# Patient Record
Sex: Female | Born: 1990 | Race: Black or African American | Hispanic: No | Marital: Single | Smoking: Never smoker
Health system: Southern US, Community
[De-identification: ages and names within clinical notes are randomized; demographics above are authoritative.]

---
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
## 2009-11-29 ENCOUNTER — Emergency Department (HOSPITAL_COMMUNITY): Admission: EM | Admit: 2009-11-29 | Discharge: 2009-11-29 | Payer: Self-pay | Admitting: Emergency Medicine

## 2009-12-16 ENCOUNTER — Emergency Department (HOSPITAL_COMMUNITY): Admission: EM | Admit: 2009-12-16 | Discharge: 2009-12-16 | Payer: Self-pay | Admitting: Emergency Medicine

## 2010-06-26 ENCOUNTER — Emergency Department (HOSPITAL_COMMUNITY): Admission: EM | Admit: 2010-06-26 | Discharge: 2010-06-27 | Payer: Self-pay | Admitting: Emergency Medicine

## 2011-01-11 LAB — DIFFERENTIAL
Basophils Relative: 0 % (ref 0–1)
Eosinophils Absolute: 0.2 10*3/uL (ref 0.0–0.7)
Lymphs Abs: 1.6 10*3/uL (ref 0.7–4.0)
Monocytes Relative: 6 % (ref 3–12)
Neutro Abs: 6.5 10*3/uL (ref 1.7–7.7)
Neutrophils Relative %: 73 % (ref 43–77)

## 2011-01-11 LAB — URINALYSIS, ROUTINE W REFLEX MICROSCOPIC
Glucose, UA: NEGATIVE mg/dL
Specific Gravity, Urine: >1.03 — ABNORMAL HIGH (ref 1.005–1.030)
pH: 5.5 (ref 5.0–8.0)

## 2011-01-11 LAB — BASIC METABOLIC PANEL
Calcium: 8.8 mg/dL (ref 8.4–10.5)
Creatinine, Ser: 0.74 mg/dL (ref 0.4–1.2)
GFR calc Af Amer: >60 mL/min (ref >60)
GFR calc non Af Amer: >60 mL/min (ref >60)
Sodium: 139 mEq/L (ref 135–145)

## 2011-01-11 LAB — URINE MICROSCOPIC-ADD ON

## 2011-01-11 LAB — CBC
Hemoglobin: 11.5 g/dL — ABNORMAL LOW (ref 12.0–15.0)
MCHC: 31.7 g/dL (ref 30.0–36.0)
Platelets: 278 10*3/uL (ref 150–400)
RBC: 4.58 MIL/uL (ref 3.87–5.11)

## 2011-05-31 ENCOUNTER — Emergency Department (HOSPITAL_COMMUNITY)
Admission: EM | Admit: 2011-05-31 | Discharge: 2011-05-31 | Disposition: A | Discharge status: Home / Self Care | Payer: Worker's Compensation | Attending: Emergency Medicine | Admitting: Emergency Medicine

## 2011-05-31 DIAGNOSIS — S8390XA Sprain of unspecified site of unspecified knee, initial encounter: Secondary | ICD-10-CM

## 2011-05-31 DIAGNOSIS — R296 Repeated falls: Secondary | ICD-10-CM | POA: Insufficient documentation

## 2011-05-31 DIAGNOSIS — Y9269 Other specified industrial and construction area as the place of occurrence of the external cause: Secondary | ICD-10-CM | POA: Insufficient documentation

## 2011-05-31 DIAGNOSIS — IMO0002 Reserved for concepts with insufficient information to code with codable children: Secondary | ICD-10-CM | POA: Insufficient documentation

## 2011-05-31 MED ORDER — ONDANSETRON HCL 4 MG PO TABS
4.0000 mg | ORAL_TABLET | Freq: Once | ORAL | Status: AC
  Administered 2011-05-31: 4 mg via ORAL

## 2011-05-31 MED ORDER — HYDROCODONE-ACETAMINOPHEN 5-325 MG PO TABS: ORAL_TABLET | ORAL | Status: DC

## 2011-05-31 MED ORDER — HYDROCODONE-ACETAMINOPHEN 5-325 MG PO TABS
2.0000 | ORAL_TABLET | Freq: Once | ORAL | Status: AC
  Administered 2011-05-31: 2 via ORAL

## 2011-05-31 MED ORDER — KETOROLAC TROMETHAMINE 10 MG PO TABS
10.0000 mg | ORAL_TABLET | Freq: Once | ORAL | Status: AC
  Administered 2011-05-31: 10 mg via ORAL

## 2011-05-31 MED ORDER — IBUPROFEN 800 MG PO TABS: ORAL_TABLET | ORAL | Status: DC

## 2011-05-31 NOTE — ED Provider Notes (Signed)
History     CSN: [id number] Arrival date & time: 05/31/2011  9:55 PM  Chief Complaint  Patient presents with  . Knee Injury   Patient is a 20 y.o. female presenting with knee pain. The history is provided by the patient.  Knee Pain This is a new problem. The current episode started today. The problem occurs constantly. The problem has been unchanged. Pertinent negatives include no abdominal pain, arthralgias, chest pain, coughing or neck pain. The symptoms are aggravated by bending, standing and twisting.    History reviewed. No pertinent past medical history.  History reviewed. No pertinent past surgical history.  No family history on file.  History  Substance Use Topics  . Smoking status: Never Smoker   . Smokeless tobacco: Not on file  . Alcohol Use: No    OB History    Grav Para Term Preterm Abortions TAB SAB Ect Mult Living                  Review of Systems  Constitutional: Negative for activity change.       All ROS Neg except as noted in HPI  HENT: Negative for nosebleeds and neck pain.   Eyes: Negative for photophobia and discharge.  Respiratory: Negative for cough, shortness of breath and wheezing.   Cardiovascular: Negative for chest pain and palpitations.  Gastrointestinal: Negative for abdominal pain and blood in stool.  Genitourinary: Negative for dysuria, frequency and hematuria.  Musculoskeletal: Negative for back pain and arthralgias.  Skin: Negative.   Neurological: Negative for dizziness, seizures and speech difficulty.  Psychiatric/Behavioral: Negative for hallucinations and confusion.    Physical Exam  BP 132/63  Pulse 90  Temp(Src) 98.4 F (36.9 C) (Oral)  Resp 16  Wt 210 lb (95.255 kg)  SpO2 99%  LMP 05/29/2011  Physical Exam  Nursing note and vitals reviewed. Constitutional: She is oriented to person, place, and time. She appears well-developed and well-nourished.  Non-toxic appearance.  HENT:  Head: Normocephalic.  Right Ear:  Tympanic membrane and external ear normal.  Left Ear: Tympanic membrane and external ear normal.  Eyes: EOM and lids are normal. Pupils are equal, round, and reactive to light.  Neck: Normal range of motion. Neck supple. Carotid bruit is not present.  Cardiovascular: Normal rate, regular rhythm, normal heart sounds, intact distal pulses and normal pulses.   Pulmonary/Chest: Breath sounds normal. No respiratory distress.  Abdominal: Soft. Bowel sounds are normal. There is no tenderness. There is no guarding.  Musculoskeletal:       Pain with attempted ROM of the left knee. No effusion. Not hot. No posterior mass. No dislocation. Distal pulses wnl.  Lymphadenopathy:       Head (right side): No submandibular adenopathy present.       Head (left side): No submandibular adenopathy present.    She has no cervical adenopathy.  Neurological: She is alert and oriented to person, place, and time. She has normal strength. No cranial nerve deficit or sensory deficit.  Skin: Skin is warm and dry.  Psychiatric: She has a normal mood and affect. Her speech is normal.    ED Course  Procedures  MDM I have reviewed nursing notes, vital signs, and all appropriate lab and imaging results for this patient.      Kathie Dike, [place] 05/31/11 2307

## 2011-05-31 NOTE — ED Notes (Signed)
SLIPPED ON WET FLOOR AT WORK, TWISTED LEFT KNEE

## 2011-05-31 NOTE — ED Notes (Signed)
Pt actually slipped and twisted left knee. She didn't fall and hit knee.

## 2011-06-17 NOTE — ED Provider Notes (Signed)
Patient evaluated and managed under my supervision by this mid-level provider.  Gerhard Munch, MD 06/17/11 2152

## 2012-06-07 ENCOUNTER — Emergency Department (HOSPITAL_COMMUNITY)
Admission: EM | Admit: 2012-06-07 | Discharge: 2012-06-08 | Disposition: A | Discharge status: Home / Self Care | Payer: No Typology Code available for payment source | Attending: Emergency Medicine | Admitting: Emergency Medicine

## 2012-06-07 ENCOUNTER — Encounter (HOSPITAL_COMMUNITY): Payer: Self-pay

## 2012-06-07 DIAGNOSIS — M545 Low back pain, unspecified: Secondary | ICD-10-CM | POA: Insufficient documentation

## 2012-06-07 DIAGNOSIS — Z043 Encounter for examination and observation following other accident: Secondary | ICD-10-CM | POA: Insufficient documentation
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
## 2012-06-08 MED ORDER — IBUPROFEN 600 MG PO TABS: 600.0000 mg | ORAL_TABLET | Freq: Three times a day (TID) | ORAL | Status: AC | PRN

## 2012-06-08 MED ORDER — HYDROCODONE-ACETAMINOPHEN 5-500 MG PO TABS: 1.0000 | ORAL_TABLET | Freq: Four times a day (QID) | ORAL | Status: AC | PRN

## 2012-06-08 MED ORDER — KETOROLAC TROMETHAMINE 60 MG/2ML IM SOLN
60.0000 mg | Freq: Once | INTRAMUSCULAR | Status: AC
  Administered 2012-06-08: 60 mg via INTRAMUSCULAR
  Filled 2012-06-08: qty 2

## 2012-06-08 MED ORDER — OXYCODONE-ACETAMINOPHEN 5-325 MG PO TABS
1.0000 | ORAL_TABLET | Freq: Once | ORAL | Status: AC
  Administered 2012-06-08: 1 via ORAL
  Filled 2012-06-08: qty 1
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
## 2012-10-10 ENCOUNTER — Encounter (HOSPITAL_COMMUNITY): Payer: Self-pay

## 2012-10-10 ENCOUNTER — Emergency Department (HOSPITAL_COMMUNITY)
Admission: EM | Admit: 2012-10-10 | Discharge: 2012-10-10 | Disposition: A | Discharge status: Home / Self Care | Payer: Self-pay | Attending: Emergency Medicine | Admitting: Emergency Medicine

## 2012-10-10 DIAGNOSIS — J020 Streptococcal pharyngitis: Secondary | ICD-10-CM | POA: Insufficient documentation

## 2012-10-10 LAB — RAPID STREP SCREEN (MED CTR MEBANE ONLY): Streptococcus, Group A Screen (Direct): POSITIVE — AB

## 2012-10-10 MED ORDER — PENICILLIN G BENZATHINE 1200000 UNIT/2ML IM SUSP
1.2000 10*6.[IU] | Freq: Once | INTRAMUSCULAR | Status: AC
  Administered 2012-10-10: 1.2 10*6.[IU] via INTRAMUSCULAR
  Filled 2012-10-10: qty 2

## 2012-10-10 NOTE — ED Notes (Signed)
Pt reports sore throat since yesterday.  

## 2012-10-10 NOTE — ED Provider Notes (Signed)
History     CSN: [id number]  Arrival date & time 10/10/12  4098   First MD Initiated Contact with Patient 10/10/12 [phone]      Chief Complaint  Patient presents with  . Sore Throat    (Consider location/radiation/quality/duration/timing/severity/associated sxs/prior treatment) HPI Comments: No fever or chills.  Patient is a 21 y.o. female presenting with pharyngitis. The history is provided by the patient. No language interpreter was used.  Sore Throat This is a new problem. The current episode started yesterday. The problem occurs constantly. Associated symptoms include a sore throat. Pertinent negatives include no chills, coughing, diaphoresis, fever, nausea, swollen glands or vomiting. The symptoms are aggravated by swallowing. Treatments tried: gargles and throat spray.    History reviewed. No pertinent past medical history.  History reviewed. No pertinent past surgical history.  No family history on file.  History  Substance Use Topics  . Smoking status: Never Smoker   . Smokeless tobacco: Not on file  . Alcohol Use: Yes    OB History    Grav Para Term Preterm Abortions TAB SAB Ect Mult Living                  Review of Systems  Constitutional: Negative for fever, chills and diaphoresis.  HENT: Positive for sore throat.   Respiratory: Negative for cough.   Gastrointestinal: Negative for nausea and vomiting.    Allergies  Review of patient's allergies indicates no known allergies.  Home Medications   Current Outpatient Rx  Name  Route  Sig  Dispense  Refill  . ACETAMINOPHEN 325 MG PO TABS   Oral   Take 650 mg by mouth every 6 (six) hours as needed. For pain         . EQ COLD PLUS PO   Oral   Take 2 tablets by mouth daily as needed. For cough/cold         . PHENOL 1.4 % MT LIQD   Mouth/Throat   Use as directed 1 spray in the mouth or throat as needed. For sore throat           BP 141/84  Pulse 98  Temp 98.7 F (37.1 C) (Oral)  Resp 22   SpO2 100%  LMP 09/03/2012  Physical Exam  Nursing note and vitals reviewed. Constitutional: She is oriented to person, place, and time. She appears well-developed and well-nourished. No distress.  HENT:  Head: Normocephalic and atraumatic.  Mouth/Throat: Uvula is midline and mucous membranes are normal. No uvula swelling. Posterior oropharyngeal erythema present. No oropharyngeal exudate, posterior oropharyngeal edema or tonsillar abscesses.  Eyes: EOM are normal.  Neck: Normal range of motion.  Cardiovascular: Normal rate, regular rhythm and normal heart sounds.   Pulmonary/Chest: Effort normal and breath sounds normal.  Abdominal: Soft. She exhibits no distension. There is no tenderness.  Musculoskeletal: Normal range of motion.  Neurological: She is alert and oriented to person, place, and time.  Skin: Skin is warm and dry.  Psychiatric: She has a normal mood and affect. Judgment normal.    ED Course  Procedures (including critical care time)  Labs Reviewed  RAPID STREP SCREEN - Abnormal; Notable for the following:    Streptococcus, Group A Screen (Direct) POSITIVE (*)     All other components within normal limits   No results found.   1. Strep pharyngitis       MDM  Bicillin LA IM Salt water gargles Chloraseptic Tylenol or ibuprofen  Evalina Field, PA 10/10/12 1140

## 2012-10-10 NOTE — ED Provider Notes (Signed)
Medical screening examination/treatment/procedure(s) were performed by non-physician practitioner and as supervising physician I was immediately available for consultation/collaboration. Khaleah Duer, MD, FACEP   Shatarra Wehling L Shamanda Len, MD 10/10/12 1455 

## 2013-05-13 ENCOUNTER — Emergency Department (HOSPITAL_COMMUNITY)
Admission: EM | Admit: 2013-05-13 | Discharge: 2013-05-13 | Disposition: A | Discharge status: Home / Self Care | Payer: Self-pay | Attending: Emergency Medicine | Admitting: Emergency Medicine

## 2013-05-13 ENCOUNTER — Encounter (HOSPITAL_COMMUNITY): Payer: Self-pay | Admitting: *Deleted

## 2013-05-13 DIAGNOSIS — Y9289 Other specified places as the place of occurrence of the external cause: Secondary | ICD-10-CM | POA: Insufficient documentation

## 2013-05-13 DIAGNOSIS — S0003XA Contusion of scalp, initial encounter: Secondary | ICD-10-CM | POA: Insufficient documentation

## 2013-05-13 DIAGNOSIS — W208XXA Other cause of strike by thrown, projected or falling object, initial encounter: Secondary | ICD-10-CM | POA: Insufficient documentation

## 2013-05-13 DIAGNOSIS — R42 Dizziness and giddiness: Secondary | ICD-10-CM | POA: Insufficient documentation

## 2013-05-13 DIAGNOSIS — Y9389 Activity, other specified: Secondary | ICD-10-CM | POA: Insufficient documentation

## 2013-05-13 DIAGNOSIS — S0093XA Contusion of unspecified part of head, initial encounter: Secondary | ICD-10-CM

## 2013-05-13 MED ORDER — KETOROLAC TROMETHAMINE 60 MG/2ML IM SOLN
60.0000 mg | Freq: Once | INTRAMUSCULAR | Status: AC
  Administered 2013-05-13: 60 mg via INTRAMUSCULAR
  Filled 2013-05-13: qty 2

## 2013-05-13 NOTE — ED Notes (Signed)
Pt had a screen door fall and hit her to top of head yesterday, denies LOC, denies blurry vision or N/V, dizzy with bending or turning fast

## 2013-05-13 NOTE — ED Notes (Signed)
MD at bedside. 

## 2013-05-13 NOTE — ED Notes (Signed)
Door hit patient in the head yesterday w/resulting HA since.  Took Tylenol which eased it.  Rates HA 7/10.  No blurred vision.  Dizziness after initial hit.

## 2013-05-13 NOTE — ED Provider Notes (Signed)
History    CSN: [id number] Arrival date & time 05/13/13  1734  First MD Initiated Contact with Patient 05/13/13 1757     Chief Complaint  Patient presents with  . Headache   (Consider location/radiation/quality/duration/timing/severity/associated sxs/prior Treatment) HPI  Patient states yesterday she was helping her and put things away in the garage and the screen door broke and hit her on top of her head. She did not have loss of consciousness. She states she felt dizzy for a few minutes. She states now whenever she moves quickly such as changing positions or moving her head she has a dizzy feeling for a few seconds. She states it means she feels like she is off balance. He has had a headache off and on since it happened. She states it's throbbing. She denies nausea, vomiting, blurred vision, numbness or tingling in her extremities, difficulty speaking. She has had a normal appetite. She states she took Tylenol 650 mg once which helped her pain. She states her current pain as a 5/10, she states her pain at its worse was earlier today and was a 9/10.  PCP None  History reviewed. No pertinent past medical history. History reviewed. No pertinent past surgical history. History reviewed. No pertinent family history. History  Substance Use Topics  . Smoking status: Never Smoker   . Smokeless tobacco: Not on file  . Alcohol Use: Yes     Comment: occasionally  unemployed   OB History   Grav Para Term Preterm Abortions TAB SAB Ect Mult Living                 Review of Systems  All other systems reviewed and are negative.    Allergies  Review of patient's allergies indicates no known allergies.  Home Medications   Current Outpatient Rx  Name  Route  Sig  Dispense  Refill  . acetaminophen (TYLENOL) 325 MG tablet   Oral   Take 650 mg by mouth every 6 (six) hours as needed. For pain         . Chlorphen-Pseudoephed-APAP (EQ COLD PLUS PO)   Oral   Take 2 tablets by mouth  daily as needed. For cough/cold         . phenol (CHLORASEPTIC) 1.4 % LIQD   Mouth/Throat   Use as directed 1 spray in the mouth or throat as needed. For sore throat          BP 140/71  Pulse 90  Temp(Src) 98 F (36.7 C) (Oral)  Resp 16  Ht 5\' 5"  (1.651 m)  Wt 267 lb (121.11 kg)  BMI 44.43 kg/m2  SpO2 100%  LMP 04/19/2013  Vital signs normal   Physical Exam  Nursing note and vitals reviewed. Constitutional: She is oriented to person, place, and time. She appears well-developed and well-nourished.  Non-toxic appearance. She does not appear ill. No distress.  Smiling, playing on her phone  HENT:  Head: Normocephalic and atraumatic.    Right Ear: External ear normal.  Left Ear: External ear normal.  Nose: Nose normal. No mucosal edema or rhinorrhea.  Mouth/Throat: Oropharynx is clear and moist and mucous membranes are normal. No dental abscesses or edematous.  Point on contact noted, no swelling, abrasions, lacerations  Eyes: Conjunctivae and EOM are normal. Pupils are equal, round, and reactive to light.  Neck: Normal range of motion and full passive range of motion without pain. Neck supple.  Pulmonary/Chest: Effort normal. No respiratory distress. She has no rhonchi. She exhibits  no crepitus.  Abdominal: Normal appearance.  Musculoskeletal: Normal range of motion. She exhibits no edema and no tenderness.  Moves all extremities well.   Neurological: She is alert and oriented to person, place, and time. She has normal strength. No cranial nerve deficit.  Grips equal, no motor deficit  Skin: Skin is warm, dry and intact. No rash noted. No erythema. No pallor.  Psychiatric: She has a normal mood and affect. Her speech is normal and behavior is normal. Her mood appears not anxious.    ED Course  Procedures (including critical care time)  Medications  ketorolac (TORADOL) injection 60 mg (not administered)      1. Head contusion, initial encounter   2. Dizziness        Plan discharge   Devoria Albe, MD, FACEP   MDM  patient was hit on the head yesterday by a screen door, she has no symptoms concerning for concussion or significant head injury. She does have some headache and intermittent dizziness since then. She has only tried one dose of Tylenol which did improve her pain.     Ward Givens, MD 05/13/13 [phone]

## 2013-09-06 ENCOUNTER — Encounter (HOSPITAL_COMMUNITY): Payer: Self-pay | Admitting: Emergency Medicine

## 2013-09-06 ENCOUNTER — Emergency Department (HOSPITAL_COMMUNITY)
Admission: EM | Admit: 2013-09-06 | Discharge: 2013-09-06 | Disposition: A | Discharge status: Home / Self Care | Payer: No Typology Code available for payment source | Attending: Emergency Medicine | Admitting: Emergency Medicine

## 2013-09-06 DIAGNOSIS — M545 Low back pain, unspecified: Secondary | ICD-10-CM | POA: Insufficient documentation

## 2013-09-06 DIAGNOSIS — Z3202 Encounter for pregnancy test, result negative: Secondary | ICD-10-CM | POA: Insufficient documentation

## 2013-09-06 DIAGNOSIS — N390 Urinary tract infection, site not specified: Secondary | ICD-10-CM | POA: Insufficient documentation

## 2013-09-06 DIAGNOSIS — R112 Nausea with vomiting, unspecified: Secondary | ICD-10-CM | POA: Insufficient documentation

## 2013-09-06 DIAGNOSIS — R1084 Generalized abdominal pain: Secondary | ICD-10-CM | POA: Insufficient documentation

## 2013-09-06 LAB — URINALYSIS, ROUTINE W REFLEX MICROSCOPIC
Glucose, UA: NEGATIVE mg/dL
Ketones, ur: 15 mg/dL — AB
Nitrite: POSITIVE — AB
Protein, ur: 30 mg/dL — AB

## 2013-09-06 LAB — POCT PREGNANCY, URINE: Preg Test, Ur: NEGATIVE

## 2013-09-06 LAB — URINE MICROSCOPIC-ADD ON

## 2013-09-06 MED ORDER — ONDANSETRON HCL 4 MG PO TABS: 4.0000 mg | ORAL_TABLET | Freq: Four times a day (QID) | ORAL | Status: DC

## 2013-09-06 MED ORDER — SULFAMETHOXAZOLE-TRIMETHOPRIM 800-160 MG PO TABS: 1.0000 | ORAL_TABLET | Freq: Two times a day (BID) | ORAL | Status: DC

## 2013-09-06 NOTE — ED Provider Notes (Signed)
CSN: [id number]     Arrival date & time 09/06/13  0919 History  This chart was scribed for Raeford Razor, MD by Bennett Scrape, ED Scribe. This patient was seen in room [facility]01/[facility]01 and the patient's care was started at 10:02 AM.   Chief Complaint  Patient presents with  . Morning Sickness    The history is provided by the patient. No language interpreter was used.    HPI Comments: Madison Wilcox is a 22 y.o. female who presents to the Emergency Department complaining of intermittent nausea with associated emesis, lower back pain, urinary frequency and upper abdominal pain over the past week. She states that the abdominal pain is located across the top of the abdomen and described as sharp. She denies any known triggers for her symptoms and states that she is experiencing mild abdominal pain currently. She has taken a negative pregnancy test 2 weeks ago. LNMP was 07/25/13. She denies any dysuria, diarrhea, fevers, chills or vaginal bleeding. She does not have a h/o chronic medical conditions.  No PCP  History reviewed. No pertinent past medical history. History reviewed. No pertinent past surgical history. No family history on file. History  Substance Use Topics  . Smoking status: Never Smoker   . Smokeless tobacco: Not on file  . Alcohol Use: Yes     Comment: occasionally   No OB history provided.  Review of Systems  Constitutional: Negative for fever and chills.  Gastrointestinal: Positive for nausea, vomiting and abdominal pain. Negative for diarrhea.  Genitourinary: Positive for frequency. Negative for dysuria.  Musculoskeletal: Positive for back pain.  All other systems reviewed and are negative.    Allergies  Review of patient's allergies indicates no known allergies.-confirmed by pt at bedside  Home Medications   Current Outpatient Rx  Name  Route  Sig  Dispense  Refill  . acetaminophen (TYLENOL) 325 MG tablet   Oral   Take 650 mg by mouth every 6 (six) hours as  needed. For pain         . Chlorphen-Pseudoephed-APAP (EQ COLD PLUS PO)   Oral   Take 2 tablets by mouth daily as needed. For cough/cold         . phenol (CHLORASEPTIC) 1.4 % LIQD   Mouth/Throat   Use as directed 1 spray in the mouth or throat as needed. For sore throat          Triage Vitals: BP 141/70  Pulse 79  Temp(Src) 98.3 F (36.8 C) (Oral)  Resp 20  Ht 5\' 5"  (1.651 m)  Wt 275 lb (124.739 kg)  BMI 45.76 kg/m2  SpO2 100%  LMP 07/25/2013  Physical Exam  Nursing note and vitals reviewed. Constitutional: She is oriented to person, place, and time. No distress.  Well-appearing, morbidly obese  HENT:  Head: Normocephalic and atraumatic.  Eyes: EOM are normal.  Neck: Normal range of motion.  Cardiovascular: Normal rate, regular rhythm and normal heart sounds.   Pulmonary/Chest: Effort normal and breath sounds normal.  Abdominal: Soft. She exhibits no distension. There is tenderness (mild diffuse). There is no rebound and no guarding.  No CVA tenderness  Musculoskeletal: Normal range of motion.  Neurological: She is alert and oriented to person, place, and time.  Skin: Skin is warm and dry.  Psychiatric: She has a normal mood and affect. Judgment normal.    ED Course  Procedures (including critical care time)  DIAGNOSTIC STUDIES: Oxygen Saturation is 100% on room air, normal by my interpretation.  COORDINATION OF CARE: 10:06 AM-Informed pt of UA showing an UTI. Discussed discharge plan which includes antibiotics with pt and pt agreed to plan. Also advised her to follow up as needed and she agreed. Addressed symptoms to return for with pt. Will provide pt with a resource list to follow up with.  Labs Review Labs Reviewed  URINALYSIS, ROUTINE W REFLEX MICROSCOPIC - Abnormal; Notable for the following:    Hgb urine dipstick TRACE (*)    Ketones, ur 15 (*)    Protein, ur 30 (*)    Urobilinogen, UA >8.0 (*)    Nitrite POSITIVE (*)    Leukocytes, UA LARGE (*)     All other components within normal limits  URINE MICROSCOPIC-ADD ON - Abnormal; Notable for the following:    Bacteria, UA MANY (*)    All other components within normal limits  POCT PREGNANCY, URINE   Imaging Review No results found.  EKG Interpretation   None       MDM   1. UTI (urinary tract infection)    22 year old female with increased urinary frequency, intermittent nausea and vomiting and mild abdominal pain. She's not pregnant. Urinalysis consistent with UTI. Also noted to bilirubinuria. Non icteric. No focal right upper quadrant tenderness. No hepatomegaly, but exam somewhat limited by body habitus. No significant past medical history or significant risk factors identified for possible hepatitis. Symptoms not consistent with biliary colic. Low suspicion for hepatobiliary etiology of current symptoms and additional testing deferred. Return precautions discussed. No PCP. Resources provided.   I personally preformed the services scribed in my presence. The recorded information has been reviewed is accurate. Raeford Razor, MD.    Raeford Razor, MD 09/06/13 1021

## 2013-09-06 NOTE — ED Notes (Signed)
Patient arrives with c/o intermittent nausea/vomiting x 1 week. Negative pregnancy test 2 weeks ago, but reports high probability of pregnancy. LMP 07/25/13.

## 2013-09-06 NOTE — Progress Notes (Signed)
ED/CM noted patient did not have health insurance and/or PCP listed in the computer.  Patient was given the [place] resource handout with information on the clinics, food pantries, and the handout for new health insurance sign-up.  Patient expressed appreciation for this. 

## 2013-09-07 LAB — URINE CULTURE

## 2013-12-20 ENCOUNTER — Emergency Department (HOSPITAL_COMMUNITY)
Admission: EM | Admit: 2013-12-20 | Discharge: 2013-12-20 | Disposition: A | Discharge status: Home / Self Care | Payer: 59 | Attending: Emergency Medicine | Admitting: Emergency Medicine

## 2013-12-20 ENCOUNTER — Encounter (HOSPITAL_COMMUNITY): Payer: Self-pay | Admitting: Emergency Medicine

## 2013-12-20 DIAGNOSIS — R05 Cough: Secondary | ICD-10-CM | POA: Insufficient documentation

## 2013-12-20 DIAGNOSIS — J029 Acute pharyngitis, unspecified: Secondary | ICD-10-CM | POA: Insufficient documentation

## 2013-12-20 DIAGNOSIS — R42 Dizziness and giddiness: Secondary | ICD-10-CM | POA: Insufficient documentation

## 2013-12-20 DIAGNOSIS — R059 Cough, unspecified: Secondary | ICD-10-CM

## 2013-12-20 MED ORDER — ALBUTEROL SULFATE HFA 108 (90 BASE) MCG/ACT IN AERS
2.0000 | INHALATION_SPRAY | Freq: Once | RESPIRATORY_TRACT | Status: AC
  Administered 2013-12-20: 2 via RESPIRATORY_TRACT
  Filled 2013-12-20: qty 6.7

## 2013-12-20 NOTE — Discharge Instructions (Signed)

## 2013-12-20 NOTE — ED Provider Notes (Signed)
CSN: [id number]     Arrival date & time 12/20/13  1851 History   First MD Initiated Contact with Patient 12/20/13 2035     Chief Complaint  Patient presents with  . Cough  . Headache      Patient is a 23 y.o. female presenting with cough and headaches. The history is provided by the patient.  Cough Severity:  Moderate Onset quality:  Gradual Duration:  2 days Timing:  Intermittent Progression:  Worsening Chronicity:  New Smoker: no   Relieved by:  Nothing Worsened by:  Nothing tried Associated symptoms: chills, headaches, sinus congestion and sore throat   Headache Associated symptoms: cough and sore throat   Associated symptoms: no vomiting   pt reports she has had cough, congestion, sore throat, chills and HA and CP from coughing for past 2 days No active SOB is reported No hemoptysis is reported. She also reports dizziness due to coughing    PMH - none  History  Substance Use Topics  . Smoking status: Never Smoker   . Smokeless tobacco: Not on file  . Alcohol Use: Yes     Comment: occasionally   OB History   Grav Para Term Preterm Abortions TAB SAB Ect Mult Living                 Review of Systems  Constitutional: Positive for chills.  HENT: Positive for sore throat.   Respiratory: Positive for cough.   Gastrointestinal: Negative for vomiting.  Neurological: Positive for headaches.      Allergies  Review of patient's allergies indicates no known allergies.  Home Medications   Current Outpatient Rx  Name  Route  Sig  Dispense  Refill  . Pseudoeph-Doxylamine-DM-APAP (NYQUIL PO)   Oral   Take 2 capsules by mouth at bedtime as needed (cold symptoms).          BP 166/73  Pulse 116  Temp(Src) 98.7 F (37.1 C) (Oral)  Resp 24  Ht 5\' 5"  (1.651 m)  Wt 261 lb (118.389 kg)  BMI 43.43 kg/m2  SpO2 98%  LMP 12/06/2013 Physical Exam CONSTITUTIONAL: Well developed/well nourished HEAD: Normocephalic/atraumatic EYES: EOMI/PERRL ENMT: Mucous  membranes moist, nasal congestion, uvula midline, pharynx without erythema/exudates, voice normal phonation NECK: supple no meningeal signs SPINE:entire spine nontender CV: S1/S2 noted, no murmurs/rubs/gallops noted LUNGS: Lungs are clear to auscultation bilaterally, no apparent distress ABDOMEN: soft, nontender, no rebound or guarding GU:no cva tenderness NEURO: Pt is awake/alert, moves all extremitiesx4 EXTREMITIES: pulses normal, full ROM SKIN: warm, color normal PSYCH: no abnormalities of mood noted  ED Course  Procedures   MDM   Final diagnoses:  Cough    Nursing notes including past medical history and social history reviewed and considered in documentation   Suspect viral URI, pt noticeably coughing in room Albuterol inhaler given for symptomatic relief of cough Stable for d/c home    Joya Gaskinsonald W Sylvana Bonk, MD 12/20/13 2129

## 2013-12-20 NOTE — ED Notes (Addendum)
Patient reports cough that started yesterday. Headache and dizziness that started today. Also reports congestion and runny nose. Patient ambulatory in triage with no difficulty. Denies nausea, vomiting, diarrhea, or other symptoms.

## 2014-03-02 ENCOUNTER — Encounter (HOSPITAL_COMMUNITY): Payer: Self-pay | Admitting: Emergency Medicine

## 2014-03-02 ENCOUNTER — Emergency Department (HOSPITAL_COMMUNITY)
Admission: EM | Admit: 2014-03-02 | Discharge: 2014-03-02 | Disposition: A | Discharge status: Home / Self Care | Payer: 59 | Attending: Emergency Medicine | Admitting: Emergency Medicine

## 2014-03-02 ENCOUNTER — Emergency Department (HOSPITAL_COMMUNITY): Payer: 59

## 2014-03-02 DIAGNOSIS — X503XXA Overexertion from repetitive movements, initial encounter: Secondary | ICD-10-CM | POA: Insufficient documentation

## 2014-03-02 DIAGNOSIS — Y9389 Activity, other specified: Secondary | ICD-10-CM | POA: Insufficient documentation

## 2014-03-02 DIAGNOSIS — Y9229 Other specified public building as the place of occurrence of the external cause: Secondary | ICD-10-CM | POA: Insufficient documentation

## 2014-03-02 DIAGNOSIS — IMO0002 Reserved for concepts with insufficient information to code with codable children: Secondary | ICD-10-CM | POA: Insufficient documentation

## 2014-03-02 DIAGNOSIS — S46919A Strain of unspecified muscle, fascia and tendon at shoulder and upper arm level, unspecified arm, initial encounter: Secondary | ICD-10-CM

## 2014-03-02 DIAGNOSIS — Y990 Civilian activity done for income or pay: Secondary | ICD-10-CM | POA: Insufficient documentation

## 2014-03-02 DIAGNOSIS — X500XXA Overexertion from strenuous movement or load, initial encounter: Secondary | ICD-10-CM | POA: Insufficient documentation

## 2014-03-02 MED ORDER — HYDROCODONE-ACETAMINOPHEN 5-325 MG PO TABS: 1.0000 | ORAL_TABLET | Freq: Four times a day (QID) | ORAL | Status: DC | PRN

## 2014-03-02 NOTE — ED Provider Notes (Signed)
CSN: [id number]     Arrival date & time 03/02/14  [id number] History   First MD Initiated Contact with Patient 03/02/14 0901     Chief Complaint  Patient presents with  . Shoulder Pain  . Hand Pain     (Consider location/radiation/quality/duration/timing/severity/associated sxs/prior Treatment) HPI Comments: Pt states that she woke up this morning with right shoulder pain. 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 Pt states that she can't raise it past shoulder height due to pain. No known injury. No numbness or weakness. Denies cp or sob  The history is provided by the patient. No language interpreter was used.    History reviewed. No pertinent past medical history. History reviewed. No pertinent past surgical history. History reviewed. No pertinent family history. History  Substance Use Topics  . Smoking status: Never Smoker   . Smokeless tobacco: Not on file  . Alcohol Use: Yes     Comment: occasionally   OB History   Grav Para Term Preterm Abortions TAB SAB Ect Mult Living                 Review of Systems  Constitutional: Negative.   Respiratory: Negative.   Cardiovascular: Negative.       Allergies  Review of patient's allergies indicates no known allergies.  Home Medications   Prior to Admission medications   Medication Sig Start Date End Date Taking? Authorizing Provider  acetaminophen (TYLENOL) 500 MG tablet Take 1,000 mg by mouth every 6 (six) hours as needed for headache.   Yes Historical Provider, MD   BP 121/78  Pulse 88  Resp 18  SpO2 100%  LMP 01/31/2014 Physical Exam  Nursing note and vitals reviewed. Constitutional: She is oriented to person, place, and time. She appears well-developed and well-nourished.  Cardiovascular: Normal rate and regular rhythm.   Pulmonary/Chest: Effort normal and breath sounds normal.  Musculoskeletal:       Cervical back: Normal.       Thoracic back:  Normal.       Lumbar back: Normal.  Tender in the anterior aspect of shoulder. No gross deformity. Grip strength equal. Pt has decreased rom  Neurological: She is alert and oriented to person, place, and time. She exhibits normal muscle tone. Coordination normal.    ED Course  Procedures (including critical care time) Labs Review Labs Reviewed - No data to display  Imaging Review Dg Shoulder Right  03/02/2014   CLINICAL DATA:  Right shoulder pain.  EXAM: RIGHT SHOULDER - 2+ VIEW  COMPARISON:  None.  FINDINGS: There is no evidence of fracture or dislocation. There is no evidence of arthropathy or other focal bone abnormality. Soft tissues are unremarkable.  IMPRESSION: Negative.   Electronically Signed   By: Irish LackGlenn  Yamagata M.D.   On: 03/02/2014 09:42     EKG Interpretation None      MDM   Final diagnoses:  Strain of shoulder    No spurs noted. Pt not having any neuro deficit. Likely strain. Will treat symptomatically    Teressa LowerVrinda Bayron Dalto, NP 03/02/14 1008

## 2014-03-02 NOTE — Discharge Instructions (Signed)
Muscle Strain  A muscle strain (pulled muscle) happens when a muscle is stretched beyond normal length. It happens when a sudden, violent force stretches your muscle too far. Usually, a few of the fibers in your muscle are torn. Muscle strain is common in athletes. Recovery usually takes 1 2 weeks. Complete healing takes 5 6 weeks.   HOME CARE    Follow the PRICE method of treatment to help your injury get better. Do this the first 2 3 days after the injury:   Protect. Protect the muscle to keep it from getting injured again.   Rest. Limit your activity and rest the injured body part.   Ice. Put ice in a plastic bag. Place a towel between your skin and the bag. Then, apply the ice and leave it on from 15 20 minutes each hour. After the third day, switch to moist heat packs.   Compression. Use a splint or elastic bandage on the injured area for comfort. Do not put it on too tightly.   Elevate. Keep the injured body part above the level of your heart.   Only take medicine as told by your doctor.   Warm up before doing exercise to prevent future muscle strains.  GET HELP IF:    You have more pain or puffiness (swelling) in the injured area.   You feel numbness, tingling, or notice a loss of strength in the injured area.  MAKE SURE YOU:    Understand these instructions.   Will watch your condition.   Will get help right away if you are not doing well or get worse.  Document Released: 07/23/2008 Document Revised: 08/04/2013 Document Reviewed: 05/13/2013  Exit[employer] Patient Information 2014 [employer].

## 2014-03-02 NOTE — ED Notes (Signed)
Pt states she woke up this morning with pain in right shoulder that radiates down to her hand. Pt reports "tingling" in her right hand. Pt denies chest pain, denies SOB.

## 2014-03-03 NOTE — ED Provider Notes (Signed)
Medical screening examination/treatment/procedure(s) were performed by non-physician practitioner and as supervising physician I was immediately available for consultation/collaboration.   EKG Interpretation None       Lieutenant Abarca, MD 03/03/14 1529 

## 2014-06-05 ENCOUNTER — Encounter (HOSPITAL_COMMUNITY): Payer: Self-pay | Admitting: Emergency Medicine

## 2014-06-05 ENCOUNTER — Emergency Department (HOSPITAL_COMMUNITY)
Admission: EM | Admit: 2014-06-05 | Discharge: 2014-06-05 | Disposition: A | Discharge status: Home / Self Care | Payer: 59 | Attending: Emergency Medicine | Admitting: Emergency Medicine

## 2014-06-05 ENCOUNTER — Emergency Department (HOSPITAL_COMMUNITY): Payer: 59

## 2014-06-05 DIAGNOSIS — IMO0002 Reserved for concepts with insufficient information to code with codable children: Secondary | ICD-10-CM | POA: Diagnosis not present

## 2014-06-05 DIAGNOSIS — Y9289 Other specified places as the place of occurrence of the external cause: Secondary | ICD-10-CM | POA: Insufficient documentation

## 2014-06-05 DIAGNOSIS — Y9389 Activity, other specified: Secondary | ICD-10-CM | POA: Diagnosis not present

## 2014-06-05 DIAGNOSIS — Z79899 Other long term (current) drug therapy: Secondary | ICD-10-CM | POA: Insufficient documentation

## 2014-06-05 DIAGNOSIS — Y990 Civilian activity done for income or pay: Secondary | ICD-10-CM | POA: Insufficient documentation

## 2014-06-05 DIAGNOSIS — M25569 Pain in unspecified knee: Secondary | ICD-10-CM | POA: Insufficient documentation

## 2014-06-05 DIAGNOSIS — X500XXA Overexertion from strenuous movement or load, initial encounter: Secondary | ICD-10-CM | POA: Insufficient documentation

## 2014-06-05 DIAGNOSIS — S8392XA Sprain of unspecified site of left knee, initial encounter: Secondary | ICD-10-CM

## 2014-06-05 MED ORDER — IBUPROFEN 600 MG PO TABS: 600.0000 mg | ORAL_TABLET | Freq: Three times a day (TID) | ORAL | Status: DC

## 2014-06-05 MED ORDER — HYDROCODONE-ACETAMINOPHEN 5-325 MG PO TABS: 1.0000 | ORAL_TABLET | ORAL | Status: DC | PRN

## 2014-06-05 NOTE — Discharge Instructions (Signed)
Knee Pain °The knee is the complex joint between your thigh and your lower leg. It is made up of bones, tendons, ligaments, and cartilage. The bones that make up the knee are: °· The femur in the thigh. °· The tibia and fibula in the lower leg. °· The patella or kneecap riding in the groove on the lower femur. °CAUSES  °Knee pain is a common complaint with many causes. A few of these causes are: °· Injury, such as: °¨ A ruptured ligament or tendon injury. °¨ Torn cartilage. °· Medical conditions, such as: °¨ Gout °¨ Arthritis °¨ Infections °· Overuse, over training, or overdoing a physical activity. °Knee pain can be minor or severe. Knee pain can accompany debilitating injury. Minor knee problems often respond well to self-care measures or get well on their own. More serious injuries may need medical intervention or even surgery. °SYMPTOMS °The knee is complex. Symptoms of knee problems can vary widely. Some of the problems are: °· Pain with movement and weight bearing. °· Swelling and tenderness. °· Buckling of the knee. °· Inability to straighten or extend your knee. °· Your knee locks and you cannot straighten it. °· Warmth and redness with pain and fever. °· Deformity or dislocation of the kneecap. °DIAGNOSIS  °Determining what is wrong may be very straight forward such as when there is an injury. It can also be challenging because of the complexity of the knee. Tests to make a diagnosis may include: °· Your caregiver taking a history and doing a physical exam. °· Routine X-rays can be used to rule out other problems. X-rays will not reveal a cartilage tear. Some injuries of the knee can be diagnosed by: °¨ Arthroscopy a surgical technique by which a small video camera is inserted through tiny incisions on the sides of the knee. This procedure is used to examine and repair internal knee joint problems. Tiny instruments can be used during arthroscopy to repair the torn knee cartilage (meniscus). °¨ Arthrography  is a radiology technique. A contrast liquid is directly injected into the knee joint. Internal structures of the knee joint then become visible on X-ray film. °¨ An MRI scan is a non X-ray radiology procedure in which magnetic fields and a computer produce two- or three-dimensional images of the inside of the knee. Cartilage tears are often visible using an MRI scanner. MRI scans have largely replaced arthrography in diagnosing cartilage tears of the knee. °· Blood work. °· Examination of the fluid that helps to lubricate the knee joint (synovial fluid). This is done by taking a sample out using a needle and a syringe. °TREATMENT °The treatment of knee problems depends on the cause. Some of these treatments are: °· Depending on the injury, proper casting, splinting, surgery, or physical therapy care will be needed. °· Give yourself adequate recovery time. Do not overuse your joints. If you begin to get sore during workout routines, back off. Slow down or do fewer repetitions. °· For repetitive activities such as cycling or running, maintain your strength and nutrition. °· Alternate muscle groups. For example, if you are a weight lifter, work the upper body on one day and the lower body the next. °· Either tight or weak muscles do not give the proper support for your knee. Tight or weak muscles do not absorb the stress placed on the knee joint. Keep the muscles surrounding the knee strong. °· Take care of mechanical problems. °¨ If you have flat feet, orthotics or special shoes may help.   See your caregiver if you need help. °¨ Arch supports, sometimes with wedges on the inner or outer aspect of the heel, can help. These can shift pressure away from the side of the knee most bothered by osteoarthritis. °¨ A brace called an "unloader" brace also may be used to help ease the pressure on the most arthritic side of the knee. °· If your caregiver has prescribed crutches, braces, wraps or ice, use as directed. The acronym  for this is PRICE. This means protection, rest, ice, compression, and elevation. °· Nonsteroidal anti-inflammatory drugs (NSAIDs), can help relieve pain. But if taken immediately after an injury, they may actually increase swelling. Take NSAIDs with food in your stomach. Stop them if you develop stomach problems. Do not take these if you have a history of ulcers, stomach pain, or bleeding from the bowel. Do not take without your caregiver's approval if you have problems with fluid retention, heart failure, or kidney problems. °· For ongoing knee problems, physical therapy may be helpful. °· Glucosamine and chondroitin are over-the-counter dietary supplements. Both may help relieve the pain of osteoarthritis in the knee. These medicines are different from the usual anti-inflammatory drugs. Glucosamine may decrease the rate of cartilage destruction. °· Injections of a corticosteroid drug into your knee joint may help reduce the symptoms of an arthritis flare-up. They may provide pain relief that lasts a few months. You may have to wait a few months between injections. The injections do have a small increased risk of infection, water retention, and elevated blood sugar levels. °· Hyaluronic acid injected into damaged joints may ease pain and provide lubrication. These injections may work by reducing inflammation. A series of shots may give relief for as long as 6 months. °· Topical painkillers. Applying certain ointments to your skin may help relieve the pain and stiffness of osteoarthritis. Ask your pharmacist for suggestions. Many over the-counter products are approved for temporary relief of arthritis pain. °· In some countries, doctors often prescribe topical NSAIDs for relief of chronic conditions such as arthritis and tendinitis. A review of treatment with NSAID creams found that they worked as well as oral medications but without the serious side effects. °PREVENTION °· Maintain a healthy weight. Extra pounds  put more strain on your joints. °· Get strong, stay limber. Weak muscles are a common cause of knee injuries. Stretching is important. Include flexibility exercises in your workouts. °· Be smart about exercise. If you have osteoarthritis, chronic knee pain or recurring injuries, you may need to change the way you exercise. This does not mean you have to stop being active. If your knees ache after jogging or playing basketball, consider switching to swimming, water aerobics, or other low-impact activities, at least for a few days a week. Sometimes limiting high-impact activities will provide relief. °· Make sure your shoes fit well. Choose footwear that is right for your sport. °· Protect your knees. Use the proper gear for knee-sensitive activities. Use kneepads when playing volleyball or laying carpet. Buckle your seat belt every time you drive. Most shattered kneecaps occur in car accidents. °· Rest when you are tired. °SEEK MEDICAL CARE IF:  °You have knee pain that is continual and does not seem to be getting better.  °SEEK IMMEDIATE MEDICAL CARE IF:  °Your knee joint feels hot to the touch and you have a high fever. °MAKE SURE YOU:  °· Understand these instructions. °· Will watch your condition. °· Will get help right away if you are not   doing well or get worse. Document Released: 08/11/2007 Document Revised: 01/06/2012 Document Reviewed: 08/11/2007 [facility] Patient Information 2015 [place], [place]. This information is not intended to replace advice given to you by your health care provider. Make sure you discuss any questions you have with your health care provider.   I suspect you have a knee sprain given your pain location and normal xrays.  Use ice and elevation as much as possible the next several days.   You may take the hydrocodone prescribed for pain relief.  This will make you drowsy - do not drive within 4 hours of taking this medication.  Use the ibuprofen also for inflammation.  Call the  orthopedic doctor listed for a recheck of your injury if not improving over the next several days.

## 2014-06-05 NOTE — ED Notes (Signed)
NAD noted at time of d/c inst 

## 2014-06-05 NOTE — ED Notes (Signed)
PA-C in room at this time

## 2014-06-05 NOTE — ED Provider Notes (Signed)
CSN: [id number]     Arrival date & time 06/05/14  1048 History  This chart was scribed for non-physician practitioner, Burgess AmorJulie Jaela Yepez, PA-C,working with Donnetta HutchingBrian Cook, MD, by Karle PlumberJennifer Tensley, ED Scribe. This patient was seen in room APFT24/APFT24 and the patient's care was started at 11:56 AM.  Chief Complaint  Patient presents with  . Knee Pain   Patient is a 23 y.o. female presenting with knee pain. The history is provided by the patient. No language interpreter was used.  Knee Pain Associated symptoms: no fever    HPI Comments:  Madison Wilcox is a 23 y.o. obese female who presents to the Emergency Department complaining of moderate left knee pain with gradual onset yesterday. She states the pain started with a pulling sensation that became worse as the day went on at work. Pt reports associated swelling of the knee. She states she applied ice packs yesterday after work. She states she has not taken any medications for the pain or swelling. She states she feels as if the knee is going to buckle under her. Pt states she injured this knee before with tearing ligaments and tendons approximately 9 years ago and was seen by [facility] and [facility] but does not remember which doctor. She denies numbness, weakness, or tingling of the LLE. She denies any new trauma, injury or fall. 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  She is ambulatory in triage without issue   History reviewed. No pertinent past medical history. History reviewed. No pertinent past surgical history. History reviewed. No pertinent family history. History  Substance Use Topics  . Smoking status: Never Smoker   . Smokeless tobacco: Not on file  . Alcohol Use: Yes     Comment: occasionally   OB History   Grav Para Term Preterm Abortions TAB SAB Ect Mult Living                 Review of Systems  Constitutional: Negative for fever.  Musculoskeletal: Positive for arthralgias and joint swelling.  Negative for myalgias.  Neurological: Negative for weakness and numbness.    Allergies  Review of patient's allergies indicates no known allergies.  Home Medications   Prior to Admission medications   Medication Sig Start Date End Date Taking? Authorizing Provider  acetaminophen (TYLENOL) 500 MG tablet Take 1,000 mg by mouth every 6 (six) hours as needed for headache.   Yes Historical Provider, MD  HYDROcodone-acetaminophen (NORCO/VICODIN) 5-325 MG per tablet Take 1 tablet by mouth every 4 (four) hours as needed. 06/05/14   Burgess AmorJulie Rayansh Herbst, PA-C  ibuprofen (ADVIL,MOTRIN) 600 MG tablet Take 1 tablet (600 mg total) by mouth 3 (three) times daily. For pain and swelling 06/05/14   Burgess AmorJulie Nardos Putnam, PA-C   Triage Vitals: BP 124/62  Pulse 70  Temp(Src) 98.2 F (36.8 C) (Oral)  Ht 5\' 4"  (1.626 m)  Wt 260 lb (117.935 kg)  BMI 44.61 kg/m2  SpO2 100%  LMP 05/22/2014 Physical Exam  Constitutional: She appears well-developed and well-nourished.  HENT:  Head: Atraumatic.  Neck: Normal range of motion.  Cardiovascular:  Normal pedal pulses. No peripheral edema and no calf tenderness.  Musculoskeletal: She exhibits tenderness.  TTP to medial and posterior left knee. Mild edema medially without obvious joint effusion. No ligament instability.  Neurological: She is alert. She has normal strength. She displays normal reflexes. No sensory deficit.  Skin: Skin is warm and dry.  Psychiatric: She has a normal mood and affect.  ED Course  Procedures (including critical care time) DIAGNOSTIC STUDIES: Oxygen Saturation is 100% on RA, normal by my interpretation.   COORDINATION OF CARE: 12:00 PM- Will X-Ray left knee. Will refer to orthopedics. Pt verbalizes understanding and agrees to plan.  Medications - No data to display  Labs Review Labs Reviewed - No data to display  Imaging Review Dg Knee Complete 4 Views Left  06/05/2014   CLINICAL DATA:  Knee pain. History of knee injury at age 74 affecting the  ligaments. No recent injury.  EXAM: LEFT KNEE - COMPLETE 4+ VIEW  COMPARISON:  11/29/2009  FINDINGS: There is no evidence of fracture, dislocation, or joint effusion. There is no evidence of arthropathy or other focal bone abnormality. Soft tissues are unremarkable.  IMPRESSION: Negative.   Electronically Signed   By: Rosalie Gums M.D.   On: 06/05/2014 12:52     EKG Interpretation None      MDM   Final diagnoses:  Knee sprain and strain, left, initial encounter    No obvious source of pain per plain films.  Pt placed in ace wrap,  Crutches.  RICE,  Prescribed ibuprofen,  Hydrocodone. Ortho referral - pt to call for recheck this week if not improved with tx.  The patient appears reasonably screened and/or stabilized for discharge and I doubt any other medical condition or other [facility] requiring further screening, evaluation, or treatment in the ED at this time prior to discharge.   I personally performed the services described in this documentation, which was scribed in my presence. The recorded information has been reviewed and is accurate.    Burgess Amor, PA-C 06/05/14 2150

## 2014-06-05 NOTE — ED Notes (Signed)
Pt c/o medial/posterior left knee pain that started yesterday. Denies injury or trauma.

## 2014-06-07 NOTE — ED Provider Notes (Signed)
Medical screening examination/treatment/procedure(s) were performed by non-physician practitioner and as supervising physician I was immediately available for consultation/collaboration.   EKG Interpretation None       Lilliam Chamblee, MD 06/07/14 1110 

## 2014-06-16 ENCOUNTER — Encounter: Payer: Self-pay | Admitting: Orthopedic Surgery

## 2014-06-16 ENCOUNTER — Ambulatory Visit (INDEPENDENT_AMBULATORY_CARE_PROVIDER_SITE_OTHER): Payer: 59 | Admitting: Orthopedic Surgery

## 2014-06-16 ENCOUNTER — Telehealth: Payer: Self-pay | Admitting: Orthopedic Surgery

## 2014-06-16 VITALS — BP 131/77 | Ht 64.0 in | Wt 260.0 lb

## 2014-06-16 DIAGNOSIS — M23322 Other meniscus derangements, posterior horn of medial meniscus, left knee: Secondary | ICD-10-CM

## 2014-06-16 DIAGNOSIS — M23329 Other meniscus derangements, posterior horn of medial meniscus, unspecified knee: Secondary | ICD-10-CM

## 2014-06-16 MED ORDER — IBUPROFEN 600 MG PO TABS: 600.0000 mg | ORAL_TABLET | Freq: Three times a day (TID) | ORAL | Status: DC

## 2014-06-16 MED ORDER — HYDROCODONE-ACETAMINOPHEN 5-325 MG PO TABS: 1.0000 | ORAL_TABLET | ORAL | Status: DC | PRN

## 2014-06-16 NOTE — Patient Instructions (Addendum)
[facility]

## 2014-06-16 NOTE — Progress Notes (Signed)
Chief Complaint  Patient presents with  . Knee Pain    Left knee pain, seen in the ER on 06-05-14.    HISTORY: 23 year old female twisted her left knee felt immediate burning medial knee pain and then developed some anterior knee pain along with that associated with swelling a feeling of locking and giving way. She did go to the ER they put her on hydrocodone and ibuprofen give her crutches start of a right program she still has pain with standing for long periods of time and has trouble bending her knee. Her injury date 11 days ago.  Review of systems has been recorded reviewed and signed and scanned into the chart  No past medical history on file. No past surgical history on file. She listed no surgical or medical history.  BP 131/77  Ht 5\' 4"  (1.626 m)  Wt 260 lb (117.935 kg)  BMI 44.61 kg/m2  LMP 05/22/2014 She does have obesity. Her appearance is normal she is oriented x3 her mood is normal she's walking with crutches  She has medial joint line tenderness no swelling she has full passive range of motion her knee feels stable. She is grade 5 motor strength in the left knee.  The right knee has no swelling, full range of motion, normal stability and strength  Skin normal both lower extremities good distal pulses bilaterally and no sensory changes  X-rays 4 views of the knee at the hospital show no fracture dislocation or bony abnormality  Impression torn medial meniscus  She has no insurance. I discussed surgical options with her she says she really can't afford it. So the treatment plan will be as follows she can stop the crutches she can return to work sitting she should get a knee brace to stabilize her knee I refilled her medication she should apply for the [facility] let us know if she can get approval.

## 2014-06-16 NOTE — Telephone Encounter (Signed)
Patient called, and stopped back into office this afternoon, relaying that she needs a work note (was not evident work status note needed at time of the printing of after-visit summary.)  I reviewed with nurse, and issued work note to patient.  Also, [facility], and states was advised to apply after the surgery.  Please advise regarding: schedule of a follow up appointment, or surgery.  Patient's phone #[phone]

## 2014-06-17 NOTE — Telephone Encounter (Signed)
No not after the surgery

## 2014-06-24 NOTE — Telephone Encounter (Signed)
06/23/14 called back, left message for patient to return call in reference to Dr Harrison's response.

## 2014-07-05 NOTE — Telephone Encounter (Signed)
No further response.  Follow up

## 2014-07-12 NOTE — Telephone Encounter (Signed)
No further response back from patient.  Aware to contact our office regarding  discount, and possible schedule of surgery thereafter.

## 2014-08-14 ENCOUNTER — Encounter (HOSPITAL_COMMUNITY): Payer: Self-pay | Admitting: Emergency Medicine

## 2014-08-14 ENCOUNTER — Emergency Department (HOSPITAL_COMMUNITY)
Admission: EM | Admit: 2014-08-14 | Discharge: 2014-08-14 | Disposition: A | Discharge status: Home / Self Care | Payer: 59 | Attending: Emergency Medicine | Admitting: Emergency Medicine

## 2014-08-14 DIAGNOSIS — J029 Acute pharyngitis, unspecified: Secondary | ICD-10-CM

## 2014-08-14 DIAGNOSIS — B349 Viral infection, unspecified: Secondary | ICD-10-CM

## 2014-08-14 DIAGNOSIS — Z3202 Encounter for pregnancy test, result negative: Secondary | ICD-10-CM | POA: Insufficient documentation

## 2014-08-14 LAB — POC URINE PREG, ED: Preg Test, Ur: NEGATIVE

## 2014-08-14 LAB — RAPID STREP SCREEN (MED CTR MEBANE ONLY): Streptococcus, Group A Screen (Direct): NEGATIVE

## 2014-08-14 MED ORDER — MAGIC MOUTHWASH W/LIDOCAINE: 5.0000 mL | Freq: Three times a day (TID) | ORAL | Status: DC | PRN

## 2014-08-14 MED ORDER — GUAIFENESIN-CODEINE 100-10 MG/5ML PO SYRP: 10.0000 mL | ORAL_SOLUTION | Freq: Three times a day (TID) | ORAL | Status: DC | PRN

## 2014-08-14 NOTE — ED Notes (Signed)
Pt c/o cold and cough symptoms since last Tuesday. Pt reports "throat pain has only gotten worse." Pt reports coughing up "yellow stuff." no cough present currently, temp 98.3, pain reported "mostly in throat." Pt A&O and in NAD at this time.

## 2014-08-14 NOTE — Discharge Instructions (Signed)
Pharyngitis °Pharyngitis is a sore throat (pharynx). There is redness, pain, and swelling of your throat. °HOME CARE  °· Drink enough fluids to keep your pee (urine) clear or pale yellow. °· Only take medicine as told by your doctor. °¨ You may get sick again if you do not take medicine as told. Finish your medicines, even if you start to feel better. °¨ Do not take aspirin. °· Rest. °· Rinse your mouth (gargle) with salt water (½ tsp of salt per 1 qt of water) every 1-2 hours. This will help the pain. °· If you are not at risk for choking, you can suck on hard candy or sore throat lozenges. °GET HELP IF: °· You have large, tender lumps on your neck. °· You have a rash. °· You cough up green, yellow-brown, or bloody spit. °GET HELP RIGHT AWAY IF:  °· You have a stiff neck. °· You drool or cannot swallow liquids. °· You throw up (vomit) or are not able to keep medicine or liquids down. °· You have very bad pain that does not go away with medicine. °· You have problems breathing (not from a stuffy nose). °MAKE SURE YOU:  °· Understand these instructions. °· Will watch your condition. °· Will get help right away if you are not doing well or get worse. °Document Released: 04/01/2008 Document Revised: 08/04/2013 Document Reviewed: 06/21/2013 °ExitCare® Patient Information ©2015 Exit[employer], LLC. This information is not intended to replace advice given to you by your health care provider. Make sure you discuss any questions you have with your health care provider. ° °Upper Respiratory Infection, Adult °An upper respiratory infection (URI) is also known as the common cold. It is often caused by a type of germ (virus). Colds are easily spread (contagious). You can pass it to others by kissing, coughing, sneezing, or drinking out of the same glass. Usually, you get better in 1 or 2 weeks.  °HOME CARE  °· Only take medicine as told by your doctor. °· Use a warm mist humidifier or breathe in steam from a hot shower. °· Drink  enough water and fluids to keep your pee (urine) clear or pale yellow. °· Get plenty of rest. °· Return to work when your temperature is back to normal or as told by your doctor. You may use a face mask and wash your hands to stop your cold from spreading. °GET HELP RIGHT AWAY IF:  °· After the first few days, you feel you are getting worse. °· You have questions about your medicine. °· You have chills, shortness of breath, or brown or red spit (mucus). °· You have yellow or brown snot (nasal discharge) or pain in the face, especially when you bend forward. °· You have a fever, puffy (swollen) neck, pain when you swallow, or white spots in the back of your throat. °· You have a bad headache, ear pain, sinus pain, or chest pain. °· You have a high-pitched whistling sound when you breathe in and out (wheezing). °· You have a lasting cough or cough up blood. °· You have sore muscles or a stiff neck. °MAKE SURE YOU:  °· Understand these instructions. °· Will watch your condition. °· Will get help right away if you are not doing well or get worse. °Document Released: 04/01/2008 Document Revised: 01/06/2012 Document Reviewed: 01/19/2014 °ExitCare® Patient Information ©2015 [employer]. This information is not intended to replace advice given to you by your health care provider. Make sure you discuss any questions you   have with your health care provider. ° °

## 2014-08-15 NOTE — ED Provider Notes (Signed)
CSN: [id number]     Arrival date & time 08/14/14  0903 History   First MD Initiated Contact with Patient 08/14/14 0913     Chief Complaint  Patient presents with  . Sore Throat     (Consider location/radiation/quality/duration/timing/severity/associated sxs/prior Treatment) HPI   Francoise CeoSydne Gall is a 23 y.o. female who presents to the Emergency Department complaining of cough and sore throat for  5 days.  She states the throat pain has worsened since onset and now also has a cough that is productive at times of yellow sputum.  She denies vomiting, shortness of breath, fever or chills.  She has been taking OTC cold medication w/o relief.     History reviewed. No pertinent past medical history. History reviewed. No pertinent past surgical history. No family history on file. History  Substance Use Topics  . Smoking status: Never Smoker   . Smokeless tobacco: Not on file  . Alcohol Use: Yes     Comment: occasionally   OB History   Grav Para Term Preterm Abortions TAB SAB Ect Mult Living                 Review of Systems  Constitutional: Negative for fever, chills, activity change and appetite change.  HENT: Positive for congestion and sore throat. Negative for ear pain, facial swelling, rhinorrhea, trouble swallowing and voice change.   Eyes: Negative for pain and visual disturbance.  Respiratory: Positive for cough. Negative for chest tightness, shortness of breath, wheezing and stridor.   Gastrointestinal: Negative for nausea, vomiting and abdominal pain.  Musculoskeletal: Negative for arthralgias, back pain, neck pain and neck stiffness.  Skin: Negative for color change and rash.  Neurological: Negative for dizziness, facial asymmetry, speech difficulty, numbness and headaches.  Hematological: Negative for adenopathy.  All other systems reviewed and are negative.     Allergies  Review of patient's allergies indicates no known allergies.  Home Medications   Prior to  Admission medications   Medication Sig Start Date End Date Taking? Authorizing Provider  aspirin-sod bicarb-citric acid (ALKA-SELTZER) 325 MG TBEF tablet Take 325 mg by mouth every 6 (six) hours as needed (aches and pain).   Yes Historical Provider, MD  ibuprofen (ADVIL,MOTRIN) 200 MG tablet Take 400 mg by mouth every 8 (eight) hours as needed for fever or moderate pain.   Yes Historical Provider, MD  Alum & Mag Hydroxide-Simeth (MAGIC MOUTHWASH W/LIDOCAINE) SOLN Take 5 mLs by mouth 3 (three) times daily as needed for mouth pain. Swish and spit, do not swallow 08/14/14   Wenceslaus Gist L. Doni Widmer, PA-C  guaiFENesin-codeine (ROBITUSSIN AC) 100-10 MG/5ML syrup Take 10 mLs by mouth 3 (three) times daily as needed. 08/14/14   Zenon Leaf L. Kadiatou Oplinger, PA-C  HYDROcodone-acetaminophen (NORCO/VICODIN) 5-325 MG per tablet Take 1 tablet by mouth every 4 (four) hours as needed. 06/16/14   Vickki HearingStanley E Harrison, MD   BP 127/78  Pulse 98  Temp(Src) 98.2 F (36.8 C) (Oral)  Resp 16  Ht 5\' 5"  (1.651 m)  Wt 260 lb (117.935 kg)  BMI 43.27 kg/m2  SpO2 100%  LMP 07/02/2014 Physical Exam  Nursing note and vitals reviewed. Constitutional: She is oriented to person, place, and time. She appears well-developed and well-nourished. No distress.  HENT:  Head: Normocephalic and atraumatic.  Mouth/Throat: Uvula is midline and mucous membranes are normal. No trismus in the jaw. No uvula swelling. Posterior oropharyngeal edema and posterior oropharyngeal erythema present. No oropharyngeal exudate or tonsillar abscesses.  Neck: Normal range of motion.  Neck supple.  Cardiovascular: Normal rate, regular rhythm, normal heart sounds and intact distal pulses.   No murmur heard. Pulmonary/Chest: Effort normal and breath sounds normal. No respiratory distress. She exhibits no tenderness.  Abdominal: Soft. She exhibits no distension. There is no tenderness.  Musculoskeletal: Normal range of motion. She exhibits no tenderness.   Lymphadenopathy:    She has no cervical adenopathy.  Neurological: She is alert and oriented to person, place, and time. She exhibits normal muscle tone. Coordination normal.  Skin: Skin is warm and dry.    ED Course  Procedures (including critical care time) Labs Review Labs Reviewed  RAPID STREP SCREEN  CULTURE, GROUP A STREP  POC URINE PREG, ED    Imaging Review No results found.   EKG Interpretation None      MDM   Final diagnoses:  Pharyngitis with viral syndrome    Pt is well appearing,  Non-toxic.  No concerning evidence for PTA, strep is negative , likely viral pharyngitis.  Pt agrees to symptomatic tx with robitussin AC and magic mouthwash.  Advised pt to f/u with PMD or return here if needed.  She appears stable for d/c    Andriel Omalley L. Trisha Mangleriplett, PA-C 08/15/14 2224

## 2014-08-16 LAB — CULTURE, GROUP A STREP

## 2014-08-17 ENCOUNTER — Telehealth (HOSPITAL_COMMUNITY): Payer: Self-pay

## 2014-08-17 NOTE — ED Notes (Signed)
Post ED Visit - Positive Culture Follow-up: Successful Patient Follow-Up  Culture assessed and recommendations reviewed by: [x]  Wes Kathryne Erikssonulaney, Pharm.D., BCPS []  Celedonio MiyamotoJeremy Frens, Pharm.D., BCPS []  Georgina PillionElizabeth Martin, Pharm.D., BCPS []  [place] Lake ShoresMinh Pham, [address] Rainbow BoulevardPharm.D., BCPS, AAHIVP []  Estella HuskMichelle Turner, Pharm.D., BCPS, AAHIVP []  Red ChristiansSamson Lee, Pharm.D. []  Tennis Mustassie Stewart, Pharm.D.  Positive throat culture  [x]  Patient discharged without antimicrobial prescription and treatment is now indicated []  Organism is resistant to prescribed ED discharge antimicrobial []  Patient with positive blood cultures  Changes discussed with ED provider: Mellody DrownLauren Parker PA New antibiotic prescription amoxicillin 500mg  po bid x 10 days Called to unk  Contacted patient, date 08/17/2014, time 1154   Ashley JacobsFesterman, Navayah Sok C 08/17/2014, 11:53 AM

## 2014-08-17 NOTE — Progress Notes (Signed)
ED Antimicrobial Stewardship Positive Culture Follow Up   Francoise CeoSydne Sinagra is an 23 y.o. female who presented to [facility] on 08/14/2014 with a chief complaint of  Chief Complaint  Patient presents with  . Sore Throat    Recent Results (from the past 720 hour(s))  RAPID STREP SCREEN     Status: None   Collection Time    08/14/14  9:28 AM      Result Value Ref Range Status   Streptococcus, Group A Screen (Direct) NEGATIVE  NEGATIVE Final   Comment: (NOTE)     A Rapid Antigen test may result negative if the antigen level in the     sample is below the detection level of this test. The FDA has not     cleared this test as a stand-alone test therefore the rapid antigen     negative result has reflexed to a Group A Strep culture.  CULTURE, GROUP A STREP     Status: None   Collection Time    08/14/14  9:28 AM      Result Value Ref Range Status   Specimen Description THROAT   Final   Special Requests NONE   Final   Culture     Final   Value: GROUP A STREP (S.PYOGENES) ISOLATED     [facility]   Report Status 08/16/2014 FINAL   Final    []  Treated with , organism resistant to prescribed antimicrobial [x]  Patient discharged originally without antimicrobial agent and treatment is now indicated  New antibiotic prescription: Amoxicillin 500mg  PO BID x 10 days  ED Provider: Mellody DrownLauren Parker, PA-C   Cleon Dewulaney, [place] Robert 08/17/2014, 10:26 AM Infectious Diseases Pharmacist Phone# [phone]

## 2014-08-18 NOTE — ED Provider Notes (Signed)
Medical screening examination/treatment/procedure(s) were performed by non-physician practitioner and as supervising physician I was immediately available for consultation/collaboration.   EKG Interpretation None        Adiah Guereca, MD 08/18/14 2310 

## 2014-08-23 ENCOUNTER — Telehealth (HOSPITAL_COMMUNITY): Payer: Self-pay

## 2014-10-03 ENCOUNTER — Encounter (HOSPITAL_COMMUNITY): Payer: Self-pay | Admitting: Emergency Medicine

## 2014-10-03 ENCOUNTER — Emergency Department (HOSPITAL_COMMUNITY)
Admission: EM | Admit: 2014-10-03 | Discharge: 2014-10-03 | Disposition: A | Discharge status: Home / Self Care | Payer: Self-pay | Attending: Emergency Medicine | Admitting: Emergency Medicine

## 2014-10-03 DIAGNOSIS — Z3202 Encounter for pregnancy test, result negative: Secondary | ICD-10-CM | POA: Insufficient documentation

## 2014-10-03 DIAGNOSIS — R197 Diarrhea, unspecified: Secondary | ICD-10-CM | POA: Insufficient documentation

## 2014-10-03 DIAGNOSIS — R112 Nausea with vomiting, unspecified: Secondary | ICD-10-CM | POA: Insufficient documentation

## 2014-10-03 DIAGNOSIS — Z79899 Other long term (current) drug therapy: Secondary | ICD-10-CM | POA: Insufficient documentation

## 2014-10-03 DIAGNOSIS — J019 Acute sinusitis, unspecified: Secondary | ICD-10-CM | POA: Insufficient documentation

## 2014-10-03 LAB — URINALYSIS, ROUTINE W REFLEX MICROSCOPIC
Bilirubin Urine: NEGATIVE
Glucose, UA: NEGATIVE mg/dL
Ketones, ur: NEGATIVE mg/dL
Nitrite: NEGATIVE
Protein, ur: NEGATIVE mg/dL
Specific Gravity, Urine: 1.01 (ref 1.005–1.030)
Urobilinogen, UA: 0.2 mg/dL (ref 0.0–1.0)
pH: 6 (ref 5.0–8.0)

## 2014-10-03 LAB — COMPREHENSIVE METABOLIC PANEL WITH GFR
ALT: 15 U/L (ref 0–35)
AST: 16 U/L (ref 0–37)
Albumin: 3.5 g/dL (ref 3.5–5.2)
Alkaline Phosphatase: 67 U/L (ref 39–117)
Anion gap: 12 (ref 5–15)
BUN: 7 mg/dL (ref 6–23)
CO2: 25 meq/L (ref 19–32)
Calcium: 8.8 mg/dL (ref 8.4–10.5)
Chloride: 104 meq/L (ref 96–112)
Creatinine, Ser: 0.88 mg/dL (ref 0.50–1.10)
GFR calc Af Amer: >90 mL/min
GFR calc non Af Amer: >90 mL/min
Glucose, Bld: 99 mg/dL (ref 70–99)
Potassium: 4 meq/L (ref 3.7–5.3)
Sodium: 141 meq/L (ref 137–147)
Total Bilirubin: 0.4 mg/dL (ref 0.3–1.2)
Total Protein: 7.4 g/dL (ref 6.0–8.3)

## 2014-10-03 LAB — CBC WITH DIFFERENTIAL/PLATELET
Basophils Absolute: 0 K/uL (ref 0.0–0.1)
Basophils Relative: 0 % (ref 0–1)
Eosinophils Absolute: 0.1 K/uL (ref 0.0–0.7)
Eosinophils Relative: 3 % (ref 0–5)
HCT: 38.4 % (ref 36.0–46.0)
Hemoglobin: 12.2 g/dL (ref 12.0–15.0)
Lymphocytes Relative: 24 % (ref 12–46)
Lymphs Abs: 1.2 K/uL (ref 0.7–4.0)
MCH: 25.1 pg — ABNORMAL LOW (ref 26.0–34.0)
MCHC: 31.8 g/dL (ref 30.0–36.0)
MCV: 79 fL (ref 78.0–100.0)
Monocytes Absolute: 0.4 K/uL (ref 0.1–1.0)
Monocytes Relative: 8 % (ref 3–12)
Neutro Abs: 3.3 K/uL (ref 1.7–7.7)
Neutrophils Relative %: 65 % (ref 43–77)
Platelets: 305 K/uL (ref 150–400)
RBC: 4.86 MIL/uL (ref 3.87–5.11)
RDW: 14.9 % (ref 11.5–15.5)
WBC: 5.1 K/uL (ref 4.0–10.5)

## 2014-10-03 LAB — URINE MICROSCOPIC-ADD ON

## 2014-10-03 LAB — POC URINE PREG, ED: Preg Test, Ur: NEGATIVE

## 2014-10-03 MED ORDER — PSEUDOEPHEDRINE HCL 60 MG PO TABS: 60.0000 mg | ORAL_TABLET | ORAL | Status: DC | PRN

## 2014-10-03 MED ORDER — PREDNISONE 10 MG PO TABS
ORAL_TABLET | ORAL | Status: DC
Start: 2014-10-03 — End: 2017-09-17

## 2014-10-03 MED ORDER — HYDROCOD POLST-CHLORPHEN POLST 10-8 MG/5ML PO LQCR
5.0000 mL | Freq: Once | ORAL | Status: AC
  Administered 2014-10-03: 5 mL via ORAL
  Filled 2014-10-03: qty 5

## 2014-10-03 NOTE — ED Notes (Signed)
Patient states URI symptoms x 1 week. Reports sporadic n/v/d with onset of symptoms. C/o generalized body aches today.

## 2014-10-03 NOTE — ED Provider Notes (Signed)
CSN: [id number]     Arrival date & time 10/03/14  0902 History   First MD Initiated Contact with Patient 10/03/14 [phone]     Chief Complaint  Patient presents with  . Generalized Body Aches     (Consider location/radiation/quality/duration/timing/severity/associated sxs/prior Treatment) HPI    Madison Wilcox is a 23 y.o. female who presents to the Emergency Department complaining of generalized body aches, nasal congestion, sinus pressure , cough and intermittent nausea, vomiting and diarrhea.  Cold symptoms began one week ago, but she developed body aches on the morning of ED arrival.  She states the cough is mostly non-productive, but has been blowing yellow mucus from her nose.  She has been taking oTC cold and sinus medication without relief.  She denies fever, shortness of breath, abd pain, or sore throat.  She reports tolerating fluids without persistent vomiting.    History reviewed. No pertinent past medical history. History reviewed. No pertinent past surgical history. No family history on file. History  Substance Use Topics  . Smoking status: Never Smoker   . Smokeless tobacco: Not on file  . Alcohol Use: Yes     Comment: occasionally   OB History    No data available     Review of Systems  Constitutional: Negative for fever, chills, activity change and appetite change.  HENT: Positive for congestion, rhinorrhea, sinus pressure and sneezing. Negative for facial swelling, sore throat and trouble swallowing.   Eyes: Negative for visual disturbance.  Respiratory: Positive for cough. Negative for chest tightness, shortness of breath, wheezing and stridor.   Gastrointestinal: Positive for nausea, vomiting and diarrhea. Negative for abdominal pain.  Genitourinary: Negative for dysuria, flank pain and pelvic pain.  Musculoskeletal: Negative for back pain, arthralgias, neck pain and neck stiffness.  Skin: Negative.  Negative for rash.  Neurological: Negative for dizziness,  syncope, weakness, numbness and headaches.  Hematological: Negative for adenopathy.  Psychiatric/Behavioral: Negative for confusion.  All other systems reviewed and are negative.     Allergies  Review of patient's allergies indicates no known allergies.  Home Medications   Prior to Admission medications   Medication Sig Start Date End Date Taking? Authorizing Provider  HYDROcodone-acetaminophen (NORCO/VICODIN) 5-325 MG per tablet Take 1 tablet by mouth every 4 (four) hours as needed. Patient taking differently: Take 1 tablet by mouth every 4 (four) hours as needed for moderate pain.  06/16/14  Yes Vickki HearingStanley E Harrison, MD  ibuprofen (ADVIL,MOTRIN) 200 MG tablet Take 400 mg by mouth every 8 (eight) hours as needed for fever or moderate pain.   Yes Historical Provider, MD  OVER THE COUNTER MEDICATION Take 1 tablet by mouth 2 (two) times daily as needed (sinus congestion). Light green oval tablet. Sinus pill from [employer].   Yes Historical Provider, MD  Pseudoeph-Doxylamine-DM-APAP (DAYQUIL/NYQUIL COLD/FLU RELIEF PO) Take 2 capsules by mouth 2 (two) times daily as needed (cough/congestion/aching).   Yes Historical Provider, MD  Alum & Mag Hydroxide-Simeth (MAGIC MOUTHWASH W/LIDOCAINE) SOLN Take 5 mLs by mouth 3 (three) times daily as needed for mouth pain. Swish and spit, do not swallow Patient not taking: Reported on 10/03/2014 08/14/14   Saamiya Jeppsen L. Shemica Meath, PA-C  guaiFENesin-codeine (ROBITUSSIN AC) 100-10 MG/5ML syrup Take 10 mLs by mouth 3 (three) times daily as needed. Patient not taking: Reported on 10/03/2014 08/14/14   Javone Ybanez L. Kaniyah Lisby, PA-C   BP 143/83 mmHg  Pulse 97  Temp(Src) 98.3 F (36.8 C) (Oral)  Resp 18  Ht 5\' 4"  (1.626 m)  Wt  260 lb (117.935 kg)  BMI 44.61 kg/m2  SpO2 100%  LMP 09/05/2014 Physical Exam  Constitutional: She is oriented to person, place, and time. She appears well-developed and well-nourished. No distress.  HENT:  Head: Normocephalic and atraumatic.   Right Ear: Tympanic membrane and ear canal normal.  Left Ear: Tympanic membrane and ear canal normal.  Nose: Mucosal edema and rhinorrhea present. Right sinus exhibits maxillary sinus tenderness. Left sinus exhibits maxillary sinus tenderness.  Mouth/Throat: Uvula is midline and mucous membranes are normal. No trismus in the jaw. No uvula swelling. Posterior oropharyngeal erythema present. No oropharyngeal exudate, posterior oropharyngeal edema or tonsillar abscesses.  Eyes: Conjunctivae are normal.  Neck: Normal range of motion and phonation normal. Neck supple. No Brudzinski's sign and no Kernig's sign noted.  Cardiovascular: Normal rate, regular rhythm, normal heart sounds and intact distal pulses.   No murmur heard. Pulmonary/Chest: Effort normal and breath sounds normal. No respiratory distress. She has no wheezes. She has no rales.  Abdominal: Soft. She exhibits no distension and no mass. There is no tenderness. There is no rebound and no guarding.  Musculoskeletal: Normal range of motion. She exhibits no edema.  Lymphadenopathy:    She has no cervical adenopathy.  Neurological: She is alert and oriented to person, place, and time. She exhibits normal muscle tone. Coordination normal.  Skin: Skin is warm and dry.  Nursing note and vitals reviewed.   ED Course  Procedures (including critical care time) Labs Review Labs Reviewed  URINALYSIS, ROUTINE W REFLEX MICROSCOPIC  CBC WITH DIFFERENTIAL  COMPREHENSIVE METABOLIC PANEL  POC URINE PREG, ED    Imaging Review No results found.   EKG Interpretation None      MDM   Final diagnoses:  Acute sinusitis, recurrence not specified, unspecified location  Nausea vomiting and diarrhea    Pt is non-toxic appearing.  No concerning sx's for acute abdomen.  VSS.  Likely viral process.  Patient feeling better after medications.  Agrees to rest, fluids, sudafed, prednisone taper and PMD f/u if needed.      Jennings Corado L. Trisha Mangleriplett,  PA-C 10/04/14 2122  Samuel JesterKathleen McManus, DO 10/05/14 2318

## 2014-10-03 NOTE — ED Notes (Signed)
Patient with no complaints at this time. Respirations even and unlabored. Skin warm/dry. Discharge instructions reviewed with patient at this time. Patient given opportunity to voice concerns/ask questions. Patient discharged at this time and left Emergency Department with steady gait.   

## 2014-10-03 NOTE — ED Notes (Signed)
PA at bedside.

## 2014-10-03 NOTE — Care Management Note (Signed)
ED/CM noted patient did not have health insurance and/or PCP listed in the computer.  Patient was given the [place] resource handout with information on the clinics, food pantries, and the handout for new health insurance sign-up. Pt was also given a Rx assistance/ discount card. Patient expressed appreciation for information received. 

## 2014-10-03 NOTE — Discharge Instructions (Signed)
Nausea and Vomiting Nausea means you feel sick to your stomach. Throwing up (vomiting) is a reflex where stomach contents come out of your mouth. HOME CARE   Take medicine as told by your doctor.  Do not force yourself to eat. However, you do need to drink fluids.  If you feel like eating, eat a normal diet as told by your doctor.  Eat rice, wheat, potatoes, bread, lean meats, yogurt, fruits, and vegetables.  Avoid high-fat foods.  Drink enough fluids to keep your pee (urine) clear or pale yellow.  Ask your doctor how to replace body fluid losses (rehydrate). Signs of body fluid loss (dehydration) include:  Feeling very thirsty.  Dry lips and mouth.  Feeling dizzy.  Dark pee.  Peeing less than normal.  Feeling confused.  Fast breathing or heart rate. GET HELP RIGHT AWAY IF:   You have blood in your throw up.  You have black or bloody poop (stool).  You have a bad headache or stiff neck.  You feel confused.  You have bad belly (abdominal) pain.  You have chest pain or trouble breathing.  You do not pee at least once every 8 hours.  You have cold, clammy skin.  You keep throwing up after 24 to 48 hours.  You have a fever. MAKE SURE YOU:   Understand these instructions.  Will watch your condition.  Will get help right away if you are not doing well or get worse. Document Released: 04/01/2008 Document Revised: 01/06/2012 Document Reviewed: 03/15/2011 [facility] Patient Information 2015 [place], [place]. This information is not intended to replace advice given to you by your health care provider. Make sure you discuss any questions you have with your health care provider.  Sinusitis Sinusitis is redness, soreness, and puffiness (inflammation) of the air pockets in the bones of your face (sinuses). The redness, soreness, and puffiness can cause air and mucus to get trapped in your sinuses. This can allow germs to grow and cause an infection.  HOME CARE   Drink  enough fluids to keep your pee (urine) clear or pale yellow.  Use a humidifier in your home.  Run a hot shower to create steam in the bathroom. Sit in the bathroom with the door closed. Breathe in the steam 3-4 times a day.  Put a warm, moist washcloth on your face 3-4 times a day, or as told by your doctor.  Use salt water sprays (saline sprays) to wet the thick fluid in your nose. This can help the sinuses drain.  Only take medicine as told by your doctor. GET HELP RIGHT AWAY IF:   Your pain gets worse.  You have very bad headaches.  You are sick to your stomach (nauseous).  You throw up (vomit).  You are very sleepy (drowsy) all the time.  Your face is puffy (swollen).  Your vision changes.  You have a stiff neck.  You have trouble breathing. MAKE SURE YOU:   Understand these instructions.  Will watch your condition.  Will get help right away if you are not doing well or get worse. Document Released: 04/01/2008 Document Revised: 07/08/2012 Document Reviewed: 05/19/2012 [facility] Patient Information 2015 [place], [place]LL[place]. This information is not intended to replace advice given to you by your health care provider. Make sure you discuss any questions you have with your health care provider.

## 2016-05-20 ENCOUNTER — Emergency Department (HOSPITAL_COMMUNITY): Payer: BLUE CROSS/BLUE SHIELD

## 2016-05-20 ENCOUNTER — Emergency Department (HOSPITAL_COMMUNITY)
Admission: EM | Admit: 2016-05-20 | Discharge: 2016-05-20 | Disposition: A | Discharge status: Home / Self Care | Payer: BLUE CROSS/BLUE SHIELD | Attending: Emergency Medicine | Admitting: Emergency Medicine

## 2016-05-20 ENCOUNTER — Encounter (HOSPITAL_COMMUNITY): Payer: Self-pay | Admitting: Emergency Medicine

## 2016-05-20 DIAGNOSIS — S8252XA Displaced fracture of medial malleolus of left tibia, initial encounter for closed fracture: Secondary | ICD-10-CM | POA: Insufficient documentation

## 2016-05-20 DIAGNOSIS — S8262XA Displaced fracture of lateral malleolus of left fibula, initial encounter for closed fracture: Secondary | ICD-10-CM | POA: Diagnosis not present

## 2016-05-20 DIAGNOSIS — X501XXA Overexertion from prolonged static or awkward postures, initial encounter: Secondary | ICD-10-CM | POA: Diagnosis not present

## 2016-05-20 DIAGNOSIS — Y929 Unspecified place or not applicable: Secondary | ICD-10-CM | POA: Diagnosis not present

## 2016-05-20 DIAGNOSIS — S82842A Displaced bimalleolar fracture of left lower leg, initial encounter for closed fracture: Secondary | ICD-10-CM

## 2016-05-20 DIAGNOSIS — Y999 Unspecified external cause status: Secondary | ICD-10-CM | POA: Insufficient documentation

## 2016-05-20 DIAGNOSIS — Y9367 Activity, basketball: Secondary | ICD-10-CM | POA: Insufficient documentation

## 2016-05-20 DIAGNOSIS — S99912A Unspecified injury of left ankle, initial encounter: Secondary | ICD-10-CM | POA: Diagnosis present

## 2016-05-20 MED ORDER — IBUPROFEN 800 MG PO TABS: 800.0000 mg | ORAL_TABLET | Freq: Three times a day (TID) | ORAL | 0 refills | Status: DC

## 2016-05-20 MED ORDER — HYDROCODONE-ACETAMINOPHEN 5-325 MG PO TABS: ORAL_TABLET | ORAL | 0 refills | Status: DC

## 2016-05-20 MED ORDER — IBUPROFEN 800 MG PO TABS
800.0000 mg | ORAL_TABLET | Freq: Once | ORAL | Status: AC
Start: 2016-05-20 — End: 2016-05-20
  Administered 2016-05-20: 800 mg via ORAL
  Filled 2016-05-20: qty 1

## 2016-05-20 NOTE — ED Triage Notes (Signed)
Pt reports was playing basketball yesterday. Pt reports went up to make a basket and came down on another players foot. Pt reports left ankle pain ever since. No deformity noted. Pt able to bear weight at this time.

## 2016-05-20 NOTE — Discharge Instructions (Signed)
Elevate and apply ice packs on/off to your ankle.  Crutches for one week.  Call Dr. Mort Sawyers office to arrange a follow-up

## 2016-05-21 NOTE — ED Provider Notes (Signed)
[facility]-EMERGENCY DEPT Provider Note   CSN: [id number] Arrival date & time: 05/20/16  5809  First Provider Contact:  First MD Initiated Contact with Patient 05/20/16 1000 AM     History   Chief Complaint Chief Complaint  Patient presents with  . Ankle Pain    HPI Kashay Swigert is a 25 y.o. female.  HPI  Smera Mamo is a 25 y.o. female who presents to the Emergency Department complaining of left ankle pain that began one day prior to arrival.  She states that she was playing basketball when she accidentally "rolled" her ankle.  Pain with weight bearing.  She denies other injuries, numbness or proximal tenderness.  She has not tried any therapies.    History reviewed. No pertinent past medical history.  There are no active problems to display for this patient.   History reviewed. No pertinent surgical history.  OB History    No data available       Home Medications    Prior to Admission medications   Medication Sig Start Date End Date Taking? Authorizing Provider  Alum & Mag Hydroxide-Simeth (MAGIC MOUTHWASH W/LIDOCAINE) SOLN Take 5 mLs by mouth 3 (three) times daily as needed for mouth pain. Swish and spit, do not swallow Patient not taking: Reported on 10/03/2014 08/14/14   Aqsa Sensabaugh, PA-C  guaiFENesin-codeine (ROBITUSSIN AC) 100-10 MG/5ML syrup Take 10 mLs by mouth 3 (three) times daily as needed. Patient not taking: Reported on 10/03/2014 08/14/14   Tameko Halder, PA-C  HYDROcodone-acetaminophen (NORCO/VICODIN) 5-325 MG tablet Take one tab po q 4-6 hrs prn pain 05/20/16   Future Yeldell, PA-C  ibuprofen (ADVIL,MOTRIN) 800 MG tablet Take 1 tablet (800 mg total) by mouth 3 (three) times daily. 05/20/16   Omare Bilotta, PA-C  OVER THE COUNTER MEDICATION Take 1 tablet by mouth 2 (two) times daily as needed (sinus congestion). Light green oval tablet. Sinus pill from [facility].    Historical Provider, MD  predniSONE (DELTASONE) 10 MG tablet Take 6 tablets day one, 5  tablets day two, 4 tablets day three, 3 tablets day four, 2 tablets day five, then 1 tablet day six 10/03/14   Gyanna Jarema, PA-C  Pseudoeph-Doxylamine-DM-APAP (DAYQUIL/NYQUIL COLD/FLU RELIEF PO) Take 2 capsules by mouth 2 (two) times daily as needed (cough/congestion/aching).    Historical Provider, MD  pseudoephedrine (SUDAFED) 60 MG tablet Take 1 tablet (60 mg total) by mouth every 4 (four) hours as needed for congestion. 10/03/14   Onia Shiflett, PA-C    Family History History reviewed. No pertinent family history.  Social History Social History  Substance Use Topics  . Smoking status: Never Smoker  . Smokeless tobacco: Never Used  . Alcohol use Yes     Comment: occasionally     Allergies   Review of patient's allergies indicates no known allergies.   Review of Systems Review of Systems  Constitutional: Negative for chills and fever.  Musculoskeletal: Positive for arthralgias (left ankle tenderness) and joint swelling.  Skin: Negative for color change and wound.  All other systems reviewed and are negative.    Physical Exam Updated Vital Signs BP 124/91   Pulse 82   Temp 97.6 F (36.4 C) (Oral)   Resp 16   Ht 5\' 5"  (1.651 m)   Wt 120.2 kg   LMP 04/26/2016   SpO2 100%   BMI 44.10 kg/m   Physical Exam  Constitutional: She is oriented to person, place, and time. She appears well-developed and well-nourished. No distress.  HENT:  Head: Normocephalic and atraumatic.  Cardiovascular: Normal rate, regular rhythm and intact distal pulses.   Pulmonary/Chest: Effort normal and breath sounds normal.  Musculoskeletal: She exhibits tenderness. She exhibits no deformity.  Left lateral ankle is ttp, mild STS is present.  Medial ankle is NT. ROM is preserved.  DP pulse is brisk,distal sensation intact.  No erythema, abrasion, bruising or bony deformity.  No proximal tenderness.  Neurological: She is alert and oriented to person, place, and time. She exhibits normal muscle  tone. Coordination normal.  Skin: Skin is warm and dry.  Nursing note and vitals reviewed.    ED Treatments / Results  Labs (all labs ordered are listed, but only abnormal results are displayed) Labs Reviewed - No data to display  EKG  EKG Interpretation None       Radiology Dg Ankle Complete Left  Result Date: 05/20/2016 CLINICAL DATA:  Left ankle pain.  Basketball injury. EXAM: LEFT ANKLE COMPLETE - 3+ VIEW COMPARISON:  No prior. FINDINGS: Diffuse soft tissue swelling. Slightly displaced fractures of the distal tips of the medial lateral malleoli noted. No other focal abnormality identified. IMPRESSION: Slightly displaced fractures of distal tips of the medial and lateral malleoli noted. Electronically Signed   By: Maisie Fus  Register   On: 05/20/2016 08:48   Procedures Procedures (including critical care time)  Medications Ordered in ED Medications  ibuprofen (ADVIL,MOTRIN) tablet 800 mg (800 mg Oral Given 05/20/16 1610)     Initial Impression / Assessment and Plan / ED Course  I have reviewed the triage vital signs and the nursing notes.  Pertinent labs & imaging results that were available during my care of the patient were reviewed by me and considered in my medical decision making (see chart for details).  Clinical Course    NV intact.  Minimal sx's.   Stirrup splint applied.  Pain improved.  Crutches given.  Pt agrees to RICE therapy, orthopedic f/u   Final Clinical Impressions(s) / ED Diagnoses   Final diagnoses:  Fracture, ankle closed, bimalleolar, left, initial encounter    New Prescriptions Discharge Medication List as of 05/20/2016  9:16 AM       Willie Loy, PA-C 05/21/16 2235    Bethann Berkshire, MD 05/22/16 1824

## 2016-05-23 ENCOUNTER — Ambulatory Visit (INDEPENDENT_AMBULATORY_CARE_PROVIDER_SITE_OTHER): Payer: BLUE CROSS/BLUE SHIELD | Admitting: Orthopaedic Surgery

## 2016-05-23 ENCOUNTER — Encounter: Payer: Self-pay | Admitting: Orthopaedic Surgery

## 2016-05-23 VITALS — BP 147/82 | HR 93 | Temp 97.0°F | Ht 64.0 in | Wt 280.8 lb

## 2016-05-23 DIAGNOSIS — S82842A Displaced bimalleolar fracture of left lower leg, initial encounter for closed fracture: Secondary | ICD-10-CM | POA: Diagnosis not present

## 2016-05-23 NOTE — Progress Notes (Signed)
Subjective: I broke my left ankle    Patient ID: Madison Wilcox, female    DOB: 24-Nov-1990, 25 y.o.   MRN: [id number]  HPI She hurt her left ankle playing basketball on May 18, 2016.  She twisted it.  She was seen in the ER. X-rays show bimalleolar avulsion fractures of the tips of the malleoli.  She has no other injury.  She was given a splint and crutches and ibuprofen.  She is doing well.    I have reviewed the x-rays, the report and the ER report.   Review of Systems  HENT: Negative for congestion.   Respiratory: Negative for cough and shortness of breath.   Cardiovascular: Negative for chest pain and leg swelling.  Endocrine: Negative for cold intolerance.  Musculoskeletal: Positive for arthralgias, gait problem and joint swelling.  Allergic/Immunologic: Negative for environmental allergies.   History reviewed. No pertinent past medical history.  History reviewed. No pertinent surgical history.  Current Outpatient Prescriptions on File Prior to Visit  Medication Sig Dispense Refill  . HYDROcodone-acetaminophen (NORCO/VICODIN) 5-325 MG tablet Take one tab po q 4-6 hrs prn pain 10 tablet 0  . Alum & Mag Hydroxide-Simeth (MAGIC MOUTHWASH W/LIDOCAINE) SOLN Take 5 mLs by mouth 3 (three) times daily as needed for mouth pain. Swish and spit, do not swallow (Patient not taking: Reported on 10/03/2014) 120 mL 0  . guaiFENesin-codeine (ROBITUSSIN AC) 100-10 MG/5ML syrup Take 10 mLs by mouth 3 (three) times daily as needed. (Patient not taking: Reported on 10/03/2014) 120 mL 0  . ibuprofen (ADVIL,MOTRIN) 800 MG tablet Take 1 tablet (800 mg total) by mouth 3 (three) times daily. (Patient not taking: Reported on 05/23/2016) 21 tablet 0  . OVER THE COUNTER MEDICATION Take 1 tablet by mouth 2 (two) times daily as needed (sinus congestion). Light green oval tablet. Sinus pill from [facility].    . predniSONE (DELTASONE) 10 MG tablet Take 6 tablets day one, 5 tablets day two, 4 tablets day three, 3  tablets day four, 2 tablets day five, then 1 tablet day six (Patient not taking: Reported on 05/23/2016) 21 tablet 0  . Pseudoeph-Doxylamine-DM-APAP (DAYQUIL/NYQUIL COLD/FLU RELIEF PO) Take 2 capsules by mouth 2 (two) times daily as needed (cough/congestion/aching).    . pseudoephedrine (SUDAFED) 60 MG tablet Take 1 tablet (60 mg total) by mouth every 4 (four) hours as needed for congestion. (Patient not taking: Reported on 05/23/2016) 30 tablet 0   No current facility-administered medications on file prior to visit.     Social History   Social History  . Marital status: Single    Spouse name: N/A  . Number of children: N/A  . Years of education: N/A   Occupational History  . Not on file.   Social History Main Topics  . Smoking status: Never Smoker  . Smokeless tobacco: Never Used  . Alcohol use Yes     Comment: occasionally  . Drug use: No  . Sexual activity: Yes    Birth control/ protection: Condom   Other Topics Concern  . Not on file   Social History Narrative  . No narrative on file   There is family history of diabetes, hypertension and heart disease.  BP (!) 147/82   Pulse 93   Temp 97 F (36.1 C)   Ht  (1.626 m)   Wt 280 lb 12.8 oz (127.4 kg)   LMP 04/26/2016   BMI 48.20 kg/m      Objective:   Physical Exam  Constitutional: She is oriented to person, place, and time. She appears well-developed and well-nourished.  HENT:  Head: Normocephalic and atraumatic.  Eyes: Conjunctivae and EOM are normal. Pupils are equal, round, and reactive to light.  Neck: Normal range of motion. Neck supple.  Cardiovascular: Normal rate, regular rhythm and intact distal pulses.   Pulmonary/Chest: Effort normal.  Abdominal: Soft.  Musculoskeletal: She exhibits tenderness (Pain of left ankle, slight ecchymosis bilaterally, decreased motion, NV intact.  Right ankle negative.  Using crutches.).  Neurological: She is alert and oriented to person, place, and time. She displays  normal reflexes. No cranial nerve deficit. She exhibits normal muscle tone. Coordination normal.  Skin: Skin is warm and dry.  Psychiatric: She has a normal mood and affect. Her behavior is normal. Judgment and thought content normal.          Assessment & Plan:   Encounter Diagnosis  Name Primary?  . Bimalleolar fracture of left ankle, closed, initial encounter Yes   I will give her a CAM walker.  Instructions for Contrast Baths given also.  Return in one week.  Call if any problem.  Continue Ibuprofen.  Precautions discussed.  Electronically Signed Darreld Mclean, MD 7/27/20178:54 AM

## 2016-05-30 ENCOUNTER — Encounter: Payer: Self-pay | Admitting: Orthopaedic Surgery

## 2016-05-30 ENCOUNTER — Ambulatory Visit: Payer: BLUE CROSS/BLUE SHIELD | Admitting: Orthopaedic Surgery

## 2016-05-30 VITALS — BP 120/75 | HR 78 | Temp 97.5°F | Resp 18 | Ht 64.0 in | Wt 275.0 lb

## 2016-05-30 DIAGNOSIS — S82842D Displaced bimalleolar fracture of left lower leg, subsequent encounter for closed fracture with routine healing: Secondary | ICD-10-CM

## 2016-05-30 MED ORDER — HYDROCODONE-ACETAMINOPHEN 5-325 MG PO TABS: 1.0000 | ORAL_TABLET | ORAL | 0 refills | Status: DC | PRN

## 2016-05-30 NOTE — Progress Notes (Signed)
CC:  My ankle is less sore  Her left ankle has less pain and swelling.  She is using the CAM walker  NV is intact.  ROM is full.  Encounter Diagnosis  Name Primary?  . Bimalleolar fracture of left ankle, closed, with routine healing, subsequent encounter Yes   Return in two weeks.  Call if any problem.  Precautions given.  X-rays on return.  Electronically Signed Darreld Mclean, MD 8/3/20179:05 AM

## 2016-06-13 ENCOUNTER — Ambulatory Visit (INDEPENDENT_AMBULATORY_CARE_PROVIDER_SITE_OTHER): Payer: BLUE CROSS/BLUE SHIELD | Admitting: Orthopaedic Surgery

## 2016-06-13 ENCOUNTER — Ambulatory Visit (INDEPENDENT_AMBULATORY_CARE_PROVIDER_SITE_OTHER): Payer: BLUE CROSS/BLUE SHIELD

## 2016-06-13 ENCOUNTER — Encounter: Payer: Self-pay | Admitting: Orthopaedic Surgery

## 2016-06-13 VITALS — BP 129/84 | HR 72 | Temp 98.1°F | Ht 64.0 in | Wt 285.0 lb

## 2016-06-13 DIAGNOSIS — S82842D Displaced bimalleolar fracture of left lower leg, subsequent encounter for closed fracture with routine healing: Secondary | ICD-10-CM

## 2016-06-13 MED ORDER — IBUPROFEN 800 MG PO TABS: 800.0000 mg | ORAL_TABLET | Freq: Three times a day (TID) | ORAL | 0 refills | Status: DC

## 2016-06-13 NOTE — Progress Notes (Signed)
CC:  My left ankle has some slight pain  She has been using her CAM walker.  She has some slight pain when she comes out of it.  She has less swelling.  She has no new trauma.  NV intact. ROM full  Encounter Diagnosis  Name Primary?  . Bimalleolar fracture of left ankle, closed, with routine healing, subsequent encounter Yes   Come out of CAM walker more and more.  Return in three weeks with x-rays then.  Call if any problem.  Precautions discussed.  Electronically Signed Darreld McleanWayne Yordan Martindale, MD 8/17/20174:09 PM

## 2016-06-25 ENCOUNTER — Emergency Department (HOSPITAL_COMMUNITY): Payer: BLUE CROSS/BLUE SHIELD

## 2016-06-25 ENCOUNTER — Encounter (HOSPITAL_COMMUNITY): Payer: Self-pay

## 2016-06-25 ENCOUNTER — Emergency Department (HOSPITAL_COMMUNITY)
Admission: EM | Admit: 2016-06-25 | Discharge: 2016-06-25 | Disposition: A | Discharge status: Home / Self Care | Payer: BLUE CROSS/BLUE SHIELD | Attending: Emergency Medicine | Admitting: Emergency Medicine

## 2016-06-25 DIAGNOSIS — S0093XA Contusion of unspecified part of head, initial encounter: Secondary | ICD-10-CM | POA: Insufficient documentation

## 2016-06-25 DIAGNOSIS — S161XXA Strain of muscle, fascia and tendon at neck level, initial encounter: Secondary | ICD-10-CM

## 2016-06-25 DIAGNOSIS — S3992XA Unspecified injury of lower back, initial encounter: Secondary | ICD-10-CM | POA: Diagnosis present

## 2016-06-25 DIAGNOSIS — Y939 Activity, unspecified: Secondary | ICD-10-CM | POA: Insufficient documentation

## 2016-06-25 DIAGNOSIS — Y92410 Unspecified street and highway as the place of occurrence of the external cause: Secondary | ICD-10-CM | POA: Diagnosis not present

## 2016-06-25 DIAGNOSIS — Z79899 Other long term (current) drug therapy: Secondary | ICD-10-CM | POA: Insufficient documentation

## 2016-06-25 DIAGNOSIS — Y999 Unspecified external cause status: Secondary | ICD-10-CM | POA: Insufficient documentation

## 2016-06-25 DIAGNOSIS — R51 Headache: Secondary | ICD-10-CM | POA: Insufficient documentation

## 2016-06-25 DIAGNOSIS — S39012A Strain of muscle, fascia and tendon of lower back, initial encounter: Secondary | ICD-10-CM | POA: Diagnosis not present

## 2016-06-25 LAB — I-STAT BETA HCG BLOOD, ED (MC, WL, AP ONLY): I-stat hCG, quantitative: <5 m[IU]/mL (ref <5)

## 2016-06-25 MED ORDER — HYDROCODONE-ACETAMINOPHEN 5-325 MG PO TABS: 1.0000 | ORAL_TABLET | ORAL | 0 refills | Status: DC | PRN

## 2016-06-25 MED ORDER — IBUPROFEN 600 MG PO TABS: 600.0000 mg | ORAL_TABLET | Freq: Four times a day (QID) | ORAL | 0 refills | Status: DC | PRN

## 2016-06-25 NOTE — ED Notes (Signed)
Pt ambulatory w/ steady gait to restroom. 
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
## 2016-06-27 ENCOUNTER — Encounter: Payer: Self-pay | Admitting: Orthopaedic Surgery

## 2016-06-27 ENCOUNTER — Ambulatory Visit (INDEPENDENT_AMBULATORY_CARE_PROVIDER_SITE_OTHER): Payer: BLUE CROSS/BLUE SHIELD | Admitting: Orthopaedic Surgery

## 2016-06-27 VITALS — BP 139/84 | HR 90 | Temp 97.7°F | Ht 65.5 in | Wt 288.0 lb

## 2016-06-27 DIAGNOSIS — M545 Low back pain, unspecified: Secondary | ICD-10-CM

## 2016-06-27 DIAGNOSIS — S82842D Displaced bimalleolar fracture of left lower leg, subsequent encounter for closed fracture with routine healing: Secondary | ICD-10-CM | POA: Diagnosis not present
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
## 2016-06-27 NOTE — Patient Instructions (Addendum)
Out of work 

## 2016-07-04 ENCOUNTER — Ambulatory Visit (INDEPENDENT_AMBULATORY_CARE_PROVIDER_SITE_OTHER): Payer: 59 | Admitting: Orthopaedic Surgery

## 2016-07-04 ENCOUNTER — Ambulatory Visit (INDEPENDENT_AMBULATORY_CARE_PROVIDER_SITE_OTHER): Payer: 59

## 2016-07-04 ENCOUNTER — Encounter: Payer: Self-pay | Admitting: Orthopaedic Surgery

## 2016-07-04 VITALS — BP 121/79 | HR 79 | Temp 97.5°F | Ht 65.5 in | Wt 291.0 lb

## 2016-07-04 DIAGNOSIS — S82842D Displaced bimalleolar fracture of left lower leg, subsequent encounter for closed fracture with routine healing: Secondary | ICD-10-CM

## 2016-07-04 NOTE — Progress Notes (Signed)
CC:  My ankle is not hurting  She has done well with the left ankle. She has been using the CAM walker.  NV intact. ROM full.  X-rays reported separately.  Encounter Diagnosis  Name Primary?  . Bimalleolar fracture of left ankle, closed, with routine healing, subsequent encounter Yes   I will see as needed.  Call if any problem.  Precautions discussed.  Electronically Signed Darreld McleanWayne Remiel Corti, MD 9/7/20178:44 AM

## 2016-07-11 ENCOUNTER — Encounter: Payer: Self-pay | Admitting: Orthopaedic Surgery

## 2016-07-11 ENCOUNTER — Ambulatory Visit (INDEPENDENT_AMBULATORY_CARE_PROVIDER_SITE_OTHER): Payer: 59 | Admitting: Orthopaedic Surgery

## 2016-07-11 VITALS — BP 131/85 | HR 76 | Temp 98.1°F | Ht 65.5 in | Wt 287.0 lb

## 2016-07-11 DIAGNOSIS — M545 Low back pain, unspecified: Secondary | ICD-10-CM

## 2016-07-11 NOTE — Progress Notes (Signed)
Patient RU:EAVWU:Madison Wilcox, female DOB:[birth year], 25 y.o. JWJ:[id number]  Chief Complaint  Patient presents with  . Follow-up    back pain    HPI  Madison Wilcox is a 25 y.o. female who has had back pain.  She has no further pain.  She is walking well.  Her fracture of the ankle on the left has healed.  She has no paresthesias.  She is active and doing her back exercises. HPI  Body mass index is 47.03 kg/m.  ROS  Review of Systems  HENT: Negative for congestion.   Respiratory: Negative for cough and shortness of breath.   Cardiovascular: Negative for chest pain and leg swelling.  Endocrine: Negative for cold intolerance.  Musculoskeletal: Positive for arthralgias, gait problem and joint swelling.  Allergic/Immunologic: Negative for environmental allergies.    No past medical history on file.  No past surgical history on file.  No family history on file.  Social History Social History  Substance Use Topics  . Smoking status: Never Smoker  . Smokeless tobacco: Never Used  . Alcohol use Yes     Comment: occasionally    No Known Allergies  Current Outpatient Prescriptions  Medication Sig Dispense Refill  . Alum & Mag Hydroxide-Simeth (MAGIC MOUTHWASH W/LIDOCAINE) SOLN Take 5 mLs by mouth 3 (three) times daily as needed for mouth pain. Swish and spit, do not swallow 120 mL 0  . guaiFENesin-codeine (ROBITUSSIN AC) 100-10 MG/5ML syrup Take 10 mLs by mouth 3 (three) times daily as needed. 120 mL 0  . HYDROcodone-acetaminophen (NORCO/VICODIN) 5-325 MG tablet Take 1 tablet by mouth every 4 (four) hours as needed. 10 tablet 0  . ibuprofen (ADVIL,MOTRIN) 600 MG tablet Take 1 tablet (600 mg total) by mouth every 6 (six) hours as needed. 30 tablet 0  . predniSONE (DELTASONE) 10 MG tablet Take 6 tablets day one, 5 tablets day two, 4 tablets day three, 3 tablets day four, 2 tablets day five, then 1 tablet day six 21 tablet 0  . pseudoephedrine (SUDAFED) 60 MG tablet Take 1 tablet (60  mg total) by mouth every 4 (four) hours as needed for congestion. 30 tablet 0   No current facility-administered medications for this visit.      Physical Exam  Blood pressure 131/85, pulse 76, temperature 98.1 F (36.7 C), height 5' 5.5" (1.664 m), weight 287 lb (130.2 kg), last menstrual period 06/04/2016.  Constitutional: overall normal hygiene, normal nutrition, well developed, normal grooming, normal body habitus. Assistive device:none  Musculoskeletal: gait and station Limp none, muscle tone and strength are normal, no tremors or atrophy is present.  .  Neurological: coordination overall normal.  Deep tendon reflex/nerve stretch intact.  Sensation normal.  Cranial nerves II-XII intact.   Skin:   Normal overall no scars, lesions, ulcers or rashes. No psoriasis.  Psychiatric: Alert and oriented x 3.  Recent memory intact, remote memory unclear.  Normal mood and affect. Well groomed.  Good eye contact.  Cardiovascular: overall no swelling, no varicosities, no edema bilaterally, normal temperatures of the legs and arms, no clubbing, cyanosis and good capillary refill.  Lymphatic: palpation is normal.  She has full ROM of her back.  She has normal NV status.  Gait is normal.  She has no ankle pain in either ankle.  The patient has been educated about the nature of the problem(s) and counseled on treatment options.  The patient appeared to understand what I have discussed and is in agreement with it.  Encounter Diagnosis  Name  Primary?  . Midline low back pain without sciatica Yes    PLAN Call if any problems.  Precautions discussed.  Continue current medications.   Return to clinic prn   Electronically Signed Darreld Mclean, MD 9/14/20178:59 AM

## 2016-08-16 ENCOUNTER — Telehealth: Payer: Self-pay | Admitting: Orthopaedic Surgery

## 2016-08-16 NOTE — Telephone Encounter (Signed)
Call received inquiring about medical records per phone with representative Chavonne from "[employer] [facility]" - ph# [phone] / fax [phone]; states sent request.  Responded, relaying that [facility] is our medical records provider, and representative is here today processing the request.  Also relayed that our billing contact information will again be attached to this request, as previously faxed 07/12/16. Authorization is on file.
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
## 2017-05-03 ENCOUNTER — Encounter (HOSPITAL_COMMUNITY): Payer: Self-pay | Admitting: *Deleted

## 2017-05-03 ENCOUNTER — Emergency Department (HOSPITAL_COMMUNITY)
Admission: EM | Admit: 2017-05-03 | Discharge: 2017-05-03 | Disposition: A | Discharge status: Home / Self Care | Payer: 59 | Attending: Emergency Medicine | Admitting: Emergency Medicine

## 2017-05-03 DIAGNOSIS — K047 Periapical abscess without sinus: Secondary | ICD-10-CM | POA: Insufficient documentation

## 2017-05-03 MED ORDER — IBUPROFEN 400 MG PO TABS
600.0000 mg | ORAL_TABLET | Freq: Once | ORAL | Status: AC
  Administered 2017-05-03: 600 mg via ORAL
  Filled 2017-05-03: qty 2

## 2017-05-03 MED ORDER — PENICILLIN V POTASSIUM 500 MG PO TABS: 500.0000 mg | ORAL_TABLET | Freq: Four times a day (QID) | ORAL | 0 refills | Status: AC

## 2017-05-03 MED ORDER — IBUPROFEN 600 MG PO TABS: 600.0000 mg | ORAL_TABLET | Freq: Four times a day (QID) | ORAL | 0 refills | Status: DC | PRN

## 2017-05-03 NOTE — ED Provider Notes (Signed)
[facility]-EMERGENCY DEPT Provider Note   CSN: [id number] Arrival date & time: 05/03/17  1158     History   Chief Complaint Chief Complaint  Patient presents with  . Numbness    HPI Madison Wilcox is a 26 y.o. female.  HPI 26 year old female who presents with dental pain, swelling, and numbness. Reports fractured left upper and lower posterior molars for a few weeks.  She began to have dental pain with chewing in the left upper and lower posterior teeth starting yesterday evening. This morning woke up with swelling around her left lower gums, sensation of numbness tingling over the area, and dental pain. No fevers or chills, difficulty swallowing, difficulty breathing, nausea or vomiting. Has not been seen by a dentist.  Has not taken medications for her symptoms. No alleviating factors. Symptoms aggravated by chewing.   History reviewed. No pertinent past medical history.  There are no active problems to display for this patient.   History reviewed. No pertinent surgical history.  OB History    No data available       Home Medications    Prior to Admission medications   Medication Sig Start Date End Date Taking? Authorizing Provider  Alum & Mag Hydroxide-Simeth (MAGIC MOUTHWASH W/LIDOCAINE) SOLN Take 5 mLs by mouth 3 (three) times daily as needed for mouth pain. Swish and spit, do not swallow 08/14/14   Triplett, Tammy, PA-C  guaiFENesin-codeine (ROBITUSSIN AC) 100-10 MG/5ML syrup Take 10 mLs by mouth 3 (three) times daily as needed. 08/14/14   Triplett, Tammy, PA-C  HYDROcodone-acetaminophen (NORCO/VICODIN) 5-325 MG tablet Take 1 tablet by mouth every 4 (four) hours as needed. 06/25/16   Jacalyn LefevreHaviland, Julie, MD  ibuprofen (ADVIL,MOTRIN) 600 MG tablet Take 1 tablet (600 mg total) by mouth every 6 (six) hours as needed. 06/25/16   Jacalyn LefevreHaviland, Julie, MD  predniSONE (DELTASONE) 10 MG tablet Take 6 tablets day one, 5 tablets day two, 4 tablets day three, 3 tablets day four, 2 tablets day  five, then 1 tablet day six 10/03/14   Triplett, Tammy, PA-C  pseudoephedrine (SUDAFED) 60 MG tablet Take 1 tablet (60 mg total) by mouth every 4 (four) hours as needed for congestion. 10/03/14   Pauline Ausriplett, Tammy, PA-C    Family History No family history on file.  Social History Social History  Substance Use Topics  . Smoking status: Never Smoker  . Smokeless tobacco: Never Used  . Alcohol use Yes     Comment: occasionally     Allergies   Patient has no known allergies.   Review of Systems Review of Systems  Constitutional: Negative for fever.  HENT: Positive for dental problem.   Respiratory: Negative for shortness of breath.   Cardiovascular: Negative for chest pain.  Gastrointestinal: Negative for nausea and vomiting.  Allergic/Immunologic: Negative for immunocompromised state.  Neurological: Negative for weakness and headaches.  Hematological: Does not bruise/bleed easily.  All other systems reviewed and are negative.    Physical Exam Updated Vital Signs BP (!) 142/75 (BP Location: Left Arm)   Pulse 71   Temp 98.4 F (36.9 C) (Oral)   Resp 18   Ht 5\' 6"  (1.676 m)   Wt 131.5 kg (290 lb)   LMP 04/30/2017   SpO2 100%   BMI 46.81 kg/m   Physical Exam Physical Exam  Nursing note and vitals reviewed. Constitutional: Well developed, well nourished, non-toxic, and in no acute distress Head: Normocephalic and atraumatic.  Mouth/Throat: Oropharynx is moist. Posterior oropharynx clear. There is fracture  left upper and lower molars. There is tenderness to percussion of the left lower fractured molar with gingival erythema and soft tissue swelling. No area of fluctuance. No significant facial or neck swelling. No appreciable cervical lymphadenopathy.   Neck: Normal range of motion. Neck supple.  Cardiovascular: Normal rate and regular rhythm.   Pulmonary/Chest: Effort normal and breath sounds normal.  Abdominal: Soft. There is no tenderness. There is no rebound and no  guarding.  Musculoskeletal: Normal range of motion.  Neurological: Alert, no facial droop, fluent speech, moves all extremities symmetrically and purposefully, sensation to light touch in tact throughout,  Skin: Skin is warm and dry.  Psychiatric: Cooperative   ED Treatments / Results  Labs (all labs ordered are listed, but only abnormal results are displayed) Labs Reviewed - No data to display  EKG  EKG Interpretation None       Radiology No results found.  Procedures Procedures (including critical care time)  Medications Ordered in ED Medications  ibuprofen (ADVIL,MOTRIN) tablet 600 mg (600 mg Oral Given 05/03/17 1234)     Initial Impression / Assessment and Plan / ED Course  I have reviewed the triage vital signs and the nursing notes.  Pertinent labs & imaging results that were available during my care of the patient were reviewed by me and considered in my medical decision making (see chart for details).     26 year old female, otherwise healthy who presents with 1 day of left lower dental pain, gingival swelling, and numbness inside her left lower cheek. Presentation consistent with likely periapical abscess/dental infection. Do not suspect a deep space soft tissue neck infection/abscess. Numbness not concerning for neurological process, related to swelling of the gingival/mucuousal cheek. Given dental resources, and started on antibiotics. Strict return and follow-up instructions reviewed. She expressed understanding of all discharge instructions and felt comfortable with the plan of care.   Final Clinical Impressions(s) / ED Diagnoses   Final diagnoses:  Dental infection    New Prescriptions New Prescriptions   No medications on file     Lavera Guise, MD 05/03/17 1234

## 2017-05-03 NOTE — Discharge Instructions (Signed)
You have a developing dental infection. Take antibiotics as prescribed.  Please follow-up closely with dentist. Dental resources are provided  Return for worsening symptoms, including fever, worsening swelling, difficulty swallowing or breathing, or any other symptoms concerning to you.

## 2017-05-03 NOTE — ED Triage Notes (Signed)
Pt states she woke up with left sided jaw numbness. No facial droop or unilateral weakness noted. Pt does have a broken tooth on top left jaw, states she has swelling inside her mouth on this time. NAD noted.

## 2017-06-02 ENCOUNTER — Other Ambulatory Visit: Payer: Self-pay | Admitting: Obstetrics and Gynecology

## 2017-09-17 ENCOUNTER — Emergency Department (HOSPITAL_COMMUNITY)
Admission: EM | Admit: 2017-09-17 | Discharge: 2017-09-17 | Disposition: A | Discharge status: Home / Self Care | Payer: No Typology Code available for payment source | Attending: Emergency Medicine | Admitting: Emergency Medicine

## 2017-09-17 ENCOUNTER — Emergency Department (HOSPITAL_COMMUNITY): Payer: No Typology Code available for payment source

## 2017-09-17 ENCOUNTER — Encounter (HOSPITAL_COMMUNITY): Payer: Self-pay | Admitting: Emergency Medicine

## 2017-09-17 DIAGNOSIS — S39012A Strain of muscle, fascia and tendon of lower back, initial encounter: Secondary | ICD-10-CM | POA: Diagnosis not present

## 2017-09-17 DIAGNOSIS — M549 Dorsalgia, unspecified: Secondary | ICD-10-CM

## 2017-09-17 DIAGNOSIS — Y999 Unspecified external cause status: Secondary | ICD-10-CM | POA: Insufficient documentation

## 2017-09-17 DIAGNOSIS — Y92410 Unspecified street and highway as the place of occurrence of the external cause: Secondary | ICD-10-CM | POA: Insufficient documentation

## 2017-09-17 DIAGNOSIS — Y9389 Activity, other specified: Secondary | ICD-10-CM | POA: Diagnosis not present

## 2017-09-17 DIAGNOSIS — S3992XA Unspecified injury of lower back, initial encounter: Secondary | ICD-10-CM | POA: Diagnosis present

## 2017-09-17 LAB — POC URINE PREG, ED: PREG TEST UR: NEGATIVE

## 2017-09-17 MED ORDER — IBUPROFEN 600 MG PO TABS: 600.0000 mg | ORAL_TABLET | Freq: Three times a day (TID) | ORAL | 0 refills | Status: DC | PRN

## 2017-09-17 MED ORDER — METHOCARBAMOL 500 MG PO TABS
1000.0000 mg | ORAL_TABLET | Freq: Once | ORAL | Status: AC
  Administered 2017-09-17: 1000 mg via ORAL
  Filled 2017-09-17: qty 2

## 2017-09-17 MED ORDER — KETOROLAC TROMETHAMINE 60 MG/2ML IM SOLN
60.0000 mg | Freq: Once | INTRAMUSCULAR | Status: AC
  Administered 2017-09-17: 60 mg via INTRAMUSCULAR
  Filled 2017-09-17: qty 2

## 2017-09-17 MED ORDER — METHOCARBAMOL 500 MG PO TABS: 1000.0000 mg | ORAL_TABLET | Freq: Three times a day (TID) | ORAL | 0 refills | Status: AC | PRN
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
## 2018-07-13 ENCOUNTER — Emergency Department (HOSPITAL_COMMUNITY)
Admission: EM | Admit: 2018-07-13 | Discharge: 2018-07-13 | Disposition: A | Discharge status: Home / Self Care | Payer: 59 | Attending: Emergency Medicine | Admitting: Emergency Medicine

## 2018-07-13 ENCOUNTER — Encounter (HOSPITAL_COMMUNITY): Payer: Self-pay | Admitting: *Deleted

## 2018-07-13 ENCOUNTER — Other Ambulatory Visit: Payer: Self-pay

## 2018-07-13 DIAGNOSIS — R112 Nausea with vomiting, unspecified: Secondary | ICD-10-CM | POA: Diagnosis not present

## 2018-07-13 DIAGNOSIS — Z79899 Other long term (current) drug therapy: Secondary | ICD-10-CM | POA: Insufficient documentation

## 2018-07-13 DIAGNOSIS — R1030 Lower abdominal pain, unspecified: Secondary | ICD-10-CM | POA: Diagnosis present

## 2018-07-13 LAB — COMPREHENSIVE METABOLIC PANEL
ALK PHOS: 58 U/L (ref 38–126)
ALT: 12 U/L (ref 0–44)
AST: 14 U/L — ABNORMAL LOW (ref 15–41)
Albumin: 3.3 g/dL — ABNORMAL LOW (ref 3.5–5.0)
Anion gap: 7 (ref 5–15)
BILIRUBIN TOTAL: 0.5 mg/dL (ref 0.3–1.2)
BUN: 9 mg/dL (ref 6–20)
CALCIUM: 8.4 mg/dL — AB (ref 8.9–10.3)
CO2: 22 mmol/L (ref 22–32)
CREATININE: 0.75 mg/dL (ref 0.44–1.00)
Chloride: 109 mmol/L (ref 98–111)
GFR calc Af Amer: >60 mL/min (ref >60)
Glucose, Bld: 105 mg/dL — ABNORMAL HIGH (ref 70–99)
Potassium: 3.7 mmol/L (ref 3.5–5.1)
SODIUM: 138 mmol/L (ref 135–145)
TOTAL PROTEIN: 7 g/dL (ref 6.5–8.1)

## 2018-07-13 LAB — URINALYSIS, ROUTINE W REFLEX MICROSCOPIC
Bilirubin Urine: NEGATIVE
GLUCOSE, UA: NEGATIVE mg/dL
HGB URINE DIPSTICK: NEGATIVE
Ketones, ur: NEGATIVE mg/dL
Leukocytes, UA: NEGATIVE
Nitrite: NEGATIVE
Protein, ur: NEGATIVE mg/dL
Specific Gravity, Urine: 1.009 (ref 1.005–1.030)
pH: 6 (ref 5.0–8.0)

## 2018-07-13 LAB — CBC WITH DIFFERENTIAL/PLATELET
BASOS ABS: 0 10*3/uL (ref 0.0–0.1)
BASOS PCT: 0 %
EOS ABS: 0.1 10*3/uL (ref 0.0–0.7)
Eosinophils Relative: 2 %
HCT: 33.9 % — ABNORMAL LOW (ref 36.0–46.0)
HEMOGLOBIN: 10 g/dL — AB (ref 12.0–15.0)
Lymphocytes Relative: 29 %
Lymphs Abs: 2.2 10*3/uL (ref 0.7–4.0)
MCH: 22.8 pg — ABNORMAL LOW (ref 26.0–34.0)
MCHC: 29.5 g/dL — AB (ref 30.0–36.0)
MCV: 77.4 fL — ABNORMAL LOW (ref 78.0–100.0)
MONOS PCT: 6 %
Monocytes Absolute: 0.5 10*3/uL (ref 0.1–1.0)
Neutro Abs: 4.8 10*3/uL (ref 1.7–7.7)
Neutrophils Relative %: 63 %
Platelets: 289 10*3/uL (ref 150–400)
RBC: 4.38 MIL/uL (ref 3.87–5.11)
RDW: 17.1 % — ABNORMAL HIGH (ref 11.5–15.5)
WBC: 7.5 10*3/uL (ref 4.0–10.5)

## 2018-07-13 LAB — PREGNANCY, URINE: Preg Test, Ur: NEGATIVE

## 2018-07-13 LAB — LIPASE, BLOOD: Lipase: 30 U/L (ref 11–51)

## 2018-07-13 MED ORDER — SODIUM CHLORIDE 0.9 % IV BOLUS
1000.0000 mL | Freq: Once | INTRAVENOUS | Status: AC
  Administered 2018-07-13: 1000 mL via INTRAVENOUS

## 2018-07-13 MED ORDER — ONDANSETRON HCL 4 MG/2ML IJ SOLN
4.0000 mg | Freq: Once | INTRAMUSCULAR | Status: AC
  Administered 2018-07-13: 4 mg via INTRAVENOUS
  Filled 2018-07-13: qty 2

## 2018-07-13 MED ORDER — ONDANSETRON HCL 4 MG PO TABS: 4.0000 mg | ORAL_TABLET | Freq: Three times a day (TID) | ORAL | 0 refills | Status: AC | PRN

## 2018-07-13 NOTE — ED Notes (Signed)
Pt states that the nausea is  better,

## 2018-07-13 NOTE — Discharge Instructions (Addendum)
Drink plenty of fluids (clear liquids) then start a bland diet later this morning such as toast, crackers, jello, [employer] chicken noodle soup. Use the zofran for nausea or vomiting.

## 2018-07-13 NOTE — ED Provider Notes (Signed)
[facility] EMERGENCY DEPARTMENT Provider Note   CSN: [id number] Arrival date & time: 07/13/18  0516  Time seen 5:44 AM   History   Chief Complaint Chief Complaint  Patient presents with  . Abdominal Pain    HPI Madison Wilcox is a 27 y.o. female.  HPI patient states she woke up about 1 AM and she felt sick, states she has nausea and has had about 4-5 episodes of non-bloody emesis.  The last time was around 5 AM.  States she has minimal discomfort denies diarrhea, feeling dizzy or lightheaded, and she states she has minimal discomfort just above her umbilicus.  She still has nausea.  She denies being around anybody else who is sick, she denies eating anything different or out of the ordinary or that she thinks could have made her ill.  PCP Patient, No Pcp Per   History reviewed. No pertinent past medical history.  There are no active problems to display for this patient.   History reviewed. No pertinent surgical history.   OB History   None      Home Medications    Prior to Admission medications   Medication Sig Start Date End Date Taking? Authorizing Provider  acetaminophen (TYLENOL) 500 MG tablet Take 1,000 mg by mouth every 6 (six) hours as needed.    [provider]  ibuprofen (ADVIL,MOTRIN) 600 MG tablet Take 1 tablet (600 mg total) by mouth 3 (three) times daily with meals as needed. 09/17/17   Loren Racer, MD  methocarbamol (ROBAXIN) 500 MG tablet Take 2 tablets (1,000 mg total) by mouth every 8 (eight) hours as needed for muscle spasms. 09/17/17   Loren Racer, MD  ondansetron (ZOFRAN) 4 MG tablet Take 1 tablet (4 mg total) by mouth every 8 (eight) hours as needed. 07/13/18   Devoria Albe, MD    Family History History reviewed. No pertinent family history.  Social History Social History   Tobacco Use  . Smoking status: Never Smoker  . Smokeless tobacco: Never Used  Substance Use Topics  . Alcohol use: Yes    Comment: occasionally  .  Drug use: No   employed  Allergies   Patient has no known allergies.   Review of Systems Review of Systems  All other systems reviewed and are negative.    Physical Exam Updated Vital Signs BP (!) 146/79 (BP Location: Left Arm)   Pulse 84   Temp 98 F (36.7 C) (Oral)   Resp 18   Ht 5\' 5"  (1.651 m)   Wt 122.5 kg   LMP 06/08/2018   SpO2 100%   BMI 44.93 kg/m   Vital signs normal     Physical Exam  Constitutional: She is oriented to person, place, and time. She appears well-developed and well-nourished.  Non-toxic appearance. She does not appear ill. No distress.  HENT:  Head: Normocephalic and atraumatic.  Right Ear: External ear normal.  Left Ear: External ear normal.  Nose: Nose normal. No mucosal edema or rhinorrhea.  Mouth/Throat: Oropharynx is clear and moist and mucous membranes are normal. No dental abscesses or uvula swelling.  Eyes: Pupils are equal, round, and reactive to light. Conjunctivae and EOM are normal.  Neck: Normal range of motion and full passive range of motion without pain. Neck supple.  Cardiovascular: Normal rate, regular rhythm and normal heart sounds. Exam reveals no gallop and no friction rub.  No murmur heard. Pulmonary/Chest: Effort normal and breath sounds normal. No respiratory distress. She has no wheezes. She  has no rhonchi. She has no rales. She exhibits no tenderness and no crepitus.  Abdominal: Soft. Normal appearance and bowel sounds are normal. She exhibits no distension. There is tenderness in the epigastric area. There is no rebound and no guarding.  Musculoskeletal: Normal range of motion. She exhibits no edema or tenderness.  Moves all extremities well.   Neurological: She is alert and oriented to person, place, and time. She has normal strength. No cranial nerve deficit.  Skin: Skin is warm, dry and intact. No rash noted. No erythema. No pallor.  Psychiatric: She has a normal mood and affect. Her speech is normal and behavior  is normal. Her mood appears not anxious.  Nursing note and vitals reviewed.    ED Treatments / Results  Labs (all labs ordered are listed, but only abnormal results are displayed) Results for orders placed or performed during the hospital encounter of 07/13/18  Lipase, blood  Result Value Ref Range   Lipase 30 11 - 51 U/L  Comprehensive metabolic panel  Result Value Ref Range   Sodium 138 135 - 145 mmol/L   Potassium 3.7 3.5 - 5.1 mmol/L   Chloride 109 98 - 111 mmol/L   CO2 22 22 - 32 mmol/L   Glucose, Bld 105 (H) 70 - 99 mg/dL   BUN 9 6 - 20 mg/dL   Creatinine, Ser 4.780.75 0.44 - 1.00 mg/dL   Calcium 8.4 (L) 8.9 - 10.3 mg/dL   Total Protein 7.0 6.5 - 8.1 g/dL   Albumin 3.3 (L) 3.5 - 5.0 g/dL   AST 14 (L) 15 - 41 U/L   ALT 12 0 - 44 U/L   Alkaline Phosphatase 58 38 - 126 U/L   Total Bilirubin 0.5 0.3 - 1.2 mg/dL   GFR calc non Af Amer >60 >60 mL/min   GFR calc Af Amer >60 >60 mL/min   Anion gap 7 5 - 15  CBC with Differential  Result Value Ref Range   WBC 7.5 4.0 - 10.5 K/uL   RBC 4.38 3.87 - 5.11 MIL/uL   Hemoglobin 10.0 (L) 12.0 - 15.0 g/dL   HCT 29.533.9 (L) 62.136.0 - 30.846.0 %   MCV 77.4 (L) 78.0 - 100.0 fL   MCH 22.8 (L) 26.0 - 34.0 pg   MCHC 29.5 (L) 30.0 - 36.0 g/dL   RDW 65.717.1 (H) 84.611.5 - 96.215.5 %   Platelets 289 150 - 400 K/uL   Neutrophils Relative % 63 %   Neutro Abs 4.8 1.7 - 7.7 K/uL   Lymphocytes Relative 29 %   Lymphs Abs 2.2 0.7 - 4.0 K/uL   Monocytes Relative 6 %   Monocytes Absolute 0.5 0.1 - 1.0 K/uL   Eosinophils Relative 2 %   Eosinophils Absolute 0.1 0.0 - 0.7 K/uL   Basophils Relative 0 %   Basophils Absolute 0.0 0.0 - 0.1 K/uL  Pregnancy, urine  Result Value Ref Range   Preg Test, Ur NEGATIVE NEGATIVE  Urinalysis, Routine w reflex microscopic  Result Value Ref Range   Color, Urine STRAW (A) YELLOW   APPearance CLEAR CLEAR   Specific Gravity, Urine 1.009 1.005 - 1.030   pH 6.0 5.0 - 8.0   Glucose, UA NEGATIVE NEGATIVE mg/dL   Hgb urine dipstick  NEGATIVE NEGATIVE   Bilirubin Urine NEGATIVE NEGATIVE   Ketones, ur NEGATIVE NEGATIVE mg/dL   Protein, ur NEGATIVE NEGATIVE mg/dL   Nitrite NEGATIVE NEGATIVE   Leukocytes, UA NEGATIVE NEGATIVE   Laboratory interpretation all normal except anemia  EKG None  Radiology No results found.  Procedures Procedures (including critical care time)  Medications Ordered in ED Medications  sodium chloride 0.9 % bolus 1,000 mL (1,000 mLs Intravenous New Bag/Given 07/13/18 0703)  sodium chloride 0.9 % bolus 1,000 mL (0 mLs Intravenous Stopped 07/13/18 0702)  ondansetron (ZOFRAN) injection 4 mg (4 mg Intravenous Given 07/13/18 0558)     Initial Impression / Assessment and Plan / ED Course  I have reviewed the triage vital signs and the nursing notes.  Pertinent labs & imaging results that were available during my care of the patient were reviewed by me and considered in my medical decision making (see chart for details).     Patient was given IV fluids and IV nausea medication.  Laboratory testing was done.  Recheck at 6:55 AM she states she is feeling better.  She still has minimal nausea but refused more nausea medication.  Her first IV bag has been infused.  She is also willing to try some oral fluids.  Pt got her second liter of fluids and is drinking Sprite without problems.   Final Clinical Impressions(s) / ED Diagnoses   Final diagnoses:  Non-intractable vomiting with nausea, unspecified vomiting type    ED Discharge Orders         Ordered    ondansetron (ZOFRAN) 4 MG tablet  Every 8 hours PRN     07/13/18 0709          Plan discharge  Devoria Albe, MD, Concha Pyo, MD 07/13/18 [phone]

## 2018-07-13 NOTE — ED Triage Notes (Signed)
Pt c/o lower abdominal pain that woke her up x 4 hours ago; pt states she has had some episodes of vomiting

## 2018-07-27 ENCOUNTER — Emergency Department (HOSPITAL_COMMUNITY)
Admission: EM | Admit: 2018-07-27 | Discharge: 2018-07-27 | Disposition: A | Discharge status: Home / Self Care | Payer: 59 | Attending: Emergency Medicine | Admitting: Emergency Medicine

## 2018-07-27 ENCOUNTER — Encounter (HOSPITAL_COMMUNITY): Payer: Self-pay

## 2018-07-27 ENCOUNTER — Other Ambulatory Visit: Payer: Self-pay

## 2018-07-27 DIAGNOSIS — K029 Dental caries, unspecified: Secondary | ICD-10-CM

## 2018-07-27 DIAGNOSIS — K047 Periapical abscess without sinus: Secondary | ICD-10-CM | POA: Insufficient documentation

## 2018-07-27 MED ORDER — HYDROCODONE-ACETAMINOPHEN 5-325 MG PO TABS: 1.0000 | ORAL_TABLET | ORAL | 0 refills | Status: DC | PRN

## 2018-07-27 MED ORDER — CLINDAMYCIN HCL 300 MG PO CAPS: 300.0000 mg | ORAL_CAPSULE | Freq: Four times a day (QID) | ORAL | 0 refills | Status: AC

## 2018-07-27 MED ORDER — CLINDAMYCIN HCL 150 MG PO CAPS
300.0000 mg | ORAL_CAPSULE | Freq: Once | ORAL | Status: AC
  Administered 2018-07-27: 300 mg via ORAL
  Filled 2018-07-27: qty 2

## 2018-07-27 MED ORDER — IBUPROFEN 600 MG PO TABS: 600.0000 mg | ORAL_TABLET | Freq: Four times a day (QID) | ORAL | 0 refills | Status: AC | PRN

## 2018-07-27 MED ORDER — HYDROCODONE-ACETAMINOPHEN 5-325 MG PO TABS
1.0000 | ORAL_TABLET | Freq: Once | ORAL | Status: AC
  Administered 2018-07-27: 1 via ORAL
  Filled 2018-07-27: qty 1

## 2018-07-27 NOTE — Discharge Instructions (Signed)
Complete your entire course of antibiotics as prescribed.  You  may use the hydrocodone for pain relief but do not drive within 4 hours of taking as this will make you drowsy.  Avoid applying heat or ice to this abscess area which can worsen your symptoms.  You may use warm salt water swish and spit treatment or half peroxide and water swish and spit after meals to keep this area clean as discussed.  Call the dentist listed above for further management of your symptoms.  

## 2018-07-27 NOTE — ED Notes (Signed)
Pt noted to have swelling to right lower jaw area

## 2018-07-27 NOTE — ED Provider Notes (Signed)
[facility] EMERGENCY DEPARTMENT Provider Note   CSN: [id number] Arrival date & time: 07/27/18  4098     History   Chief Complaint Chief Complaint  Patient presents with  . Dental Pain    HPI Madison Wilcox is a 27 y.o. female with no significant past medical history presenting with a 2 day history of dental pain and gingival swelling.   The patient has a history of decay in the tooth involved which has recently started to cause increased  pain.  There has been no fevers, chills, nausea or vomiting, also no complaint of difficulty swallowing, although chewing makes pain worse.  The patient has tried ibuprofen without relief of symptoms.    .  The history is provided by the patient.    History reviewed. No pertinent past medical history.  There are no active problems to display for this patient.   History reviewed. No pertinent surgical history.   OB History   None      Home Medications    Prior to Admission medications   Medication Sig Start Date End Date Taking? Authorizing Provider  acetaminophen (TYLENOL) 500 MG tablet Take 1,000 mg by mouth every 6 (six) hours as needed.    [provider]  clindamycin (CLEOCIN) 300 MG capsule Take 1 capsule (300 mg total) by mouth every 6 (six) hours for 7 days. 07/27/18 08/03/18  Burgess Amor, PA-C  HYDROcodone-acetaminophen (NORCO/VICODIN) 5-325 MG tablet Take 1 tablet by mouth every 4 (four) hours as needed. 07/27/18   Burgess Amor, PA-C  ibuprofen (ADVIL,MOTRIN) 600 MG tablet Take 1 tablet (600 mg total) by mouth every 6 (six) hours as needed. 07/27/18   Burgess Amor, PA-C  methocarbamol (ROBAXIN) 500 MG tablet Take 2 tablets (1,000 mg total) by mouth every 8 (eight) hours as needed for muscle spasms. 09/17/17   Loren Racer, MD  ondansetron (ZOFRAN) 4 MG tablet Take 1 tablet (4 mg total) by mouth every 8 (eight) hours as needed. 07/13/18   Devoria Albe, MD    Family History No family history on file.  Social  History Social History   Tobacco Use  . Smoking status: Never Smoker  . Smokeless tobacco: Never Used  Substance Use Topics  . Alcohol use: Yes    Comment: occasionally  . Drug use: No     Allergies   Patient has no known allergies.   Review of Systems Review of Systems  Constitutional: Negative for fever.  HENT: Positive for dental problem. Negative for facial swelling and sore throat.   Respiratory: Negative for shortness of breath.   Musculoskeletal: Negative for neck pain and neck stiffness.     Physical Exam Updated Vital Signs BP 139/79 (BP Location: Right Arm)   Pulse 77   Temp 98.5 F (36.9 C) (Oral)   Resp 16   Wt 117.9 kg   LMP 07/20/2018   SpO2 100%   BMI 43.27 kg/m   Physical Exam  Constitutional: She is oriented to person, place, and time. She appears well-developed and well-nourished. No distress.  HENT:  Head: Normocephalic and atraumatic.  Right Ear: Tympanic membrane and external ear normal.  Left Ear: Tympanic membrane and external ear normal.  Mouth/Throat: Oropharynx is clear and moist and mucous membranes are normal. No oral lesions. No trismus in the jaw. Dental abscesses present.    Eyes: Conjunctivae are normal.  Neck: Normal range of motion. Neck supple.  Cardiovascular: Normal rate and normal heart sounds.  Pulmonary/Chest: Effort normal.  Abdominal: She  exhibits no distension.  Musculoskeletal: Normal range of motion.  Lymphadenopathy:    She has no cervical adenopathy.  Neurological: She is alert and oriented to person, place, and time.  Skin: Skin is warm and dry. No erythema.  Psychiatric: She has a normal mood and affect.     ED Treatments / Results  Labs (all labs ordered are listed, but only abnormal results are displayed) Labs Reviewed - No data to display  EKG None  Radiology No results found.  Procedures Procedures (including critical care time)  Medications Ordered in ED Medications  clindamycin  (CLEOCIN) capsule 300 mg (has no administration in time range)  HYDROcodone-acetaminophen (NORCO/VICODIN) 5-325 MG per tablet 1 tablet (has no administration in time range)     Initial Impression / Assessment and Plan / ED Course  I have reviewed the triage vital signs and the nursing notes.  Pertinent labs & imaging results that were available during my care of the patient were reviewed by me and considered in my medical decision making (see chart for details).     Patient was placed on clindamycin, 1 day supply of hydrocodone provided along with ibuprofen 600 mg 3 times daily.  Dental referrals were given.  Ice to complete entire course of antibiotics and to follow-up with dentistry towards the end of the antibiotic course.  She has no trismus, no sublingual edema or induration, no drainable abscess on exam.  Final Clinical Impressions(s) / ED Diagnoses   Final diagnoses:  Infected dental caries    ED Discharge Orders         Ordered    clindamycin (CLEOCIN) 300 MG capsule  Every 6 hours     07/27/18 0815    HYDROcodone-acetaminophen (NORCO/VICODIN) 5-325 MG tablet  Every 4 hours PRN     07/27/18 0815    ibuprofen (ADVIL,MOTRIN) 600 MG tablet  Every 6 hours PRN     07/27/18 0815           Burgess Amor, PA-C 07/27/18 1610    Pricilla Loveless, MD 07/27/18 [phone]

## 2018-07-27 NOTE — ED Triage Notes (Signed)
Pt reports dental pain and swelling to right lower jaw since saturday

## 2018-10-04 ENCOUNTER — Emergency Department (HOSPITAL_COMMUNITY)
Admission: EM | Admit: 2018-10-04 | Discharge: 2018-10-04 | Disposition: A | Discharge status: Home / Self Care | Payer: Self-pay | Attending: Emergency Medicine | Admitting: Emergency Medicine

## 2018-10-04 ENCOUNTER — Other Ambulatory Visit: Payer: Self-pay

## 2018-10-04 ENCOUNTER — Encounter (HOSPITAL_COMMUNITY): Payer: Self-pay | Admitting: *Deleted

## 2018-10-04 DIAGNOSIS — K047 Periapical abscess without sinus: Secondary | ICD-10-CM | POA: Insufficient documentation

## 2018-10-04 DIAGNOSIS — Z79899 Other long term (current) drug therapy: Secondary | ICD-10-CM | POA: Insufficient documentation

## 2018-10-04 MED ORDER — CLINDAMYCIN HCL 300 MG PO CAPS: 300.0000 mg | ORAL_CAPSULE | Freq: Four times a day (QID) | ORAL | 0 refills | Status: AC

## 2018-10-04 MED ORDER — HYDROCODONE-ACETAMINOPHEN 5-325 MG PO TABS
2.0000 | ORAL_TABLET | Freq: Once | ORAL | Status: AC
  Administered 2018-10-04: 2 via ORAL
  Filled 2018-10-04: qty 2

## 2018-10-04 MED ORDER — CLINDAMYCIN HCL 150 MG PO CAPS
300.0000 mg | ORAL_CAPSULE | Freq: Once | ORAL | Status: AC
  Administered 2018-10-04: 300 mg via ORAL
  Filled 2018-10-04: qty 2

## 2018-10-04 MED ORDER — HYDROCODONE-ACETAMINOPHEN 5-325 MG PO TABS: 1.0000 | ORAL_TABLET | ORAL | 0 refills | Status: AC | PRN

## 2018-10-04 NOTE — ED Provider Notes (Signed)
[facility] Provider Note   CSN: [id number] Arrival date & time: 10/04/18  1804     History   Chief Complaint Chief Complaint  Patient presents with  . Dental Pain    HPI Madison Wilcox is a 27 y.o. female.  The history is provided by the patient. No language interpreter was used.  Dental Pain   This is a new problem. The current episode started yesterday. The problem occurs constantly. The pain is moderate. She has tried nothing for the symptoms. The treatment provided no relief.   Pt complains of swelling to her face and lower gumline  No past medical history on file.  There are no active problems to display for this patient.   No past surgical history on file.   OB History   None      Home Medications    Prior to Admission medications   Medication Sig Start Date End Date Taking? Authorizing Provider  acetaminophen (TYLENOL) 500 MG tablet Take 1,000 mg by mouth every 6 (six) hours as needed.    [provider]  HYDROcodone-acetaminophen (NORCO/VICODIN) 5-325 MG tablet Take 1 tablet by mouth every 4 (four) hours as needed. 07/27/18   Burgess AmorIdol, Julie, PA-C  ibuprofen (ADVIL,MOTRIN) 600 MG tablet Take 1 tablet (600 mg total) by mouth every 6 (six) hours as needed. 07/27/18   Burgess AmorIdol, Julie, PA-C  methocarbamol (ROBAXIN) 500 MG tablet Take 2 tablets (1,000 mg total) by mouth every 8 (eight) hours as needed for muscle spasms. 09/17/17   Loren RacerYelverton, David, MD  ondansetron (ZOFRAN) 4 MG tablet Take 1 tablet (4 mg total) by mouth every 8 (eight) hours as needed. 07/13/18   Devoria AlbeKnapp, Iva, MD    Family History No family history on file.  Social History Social History   Tobacco Use  . Smoking status: Never Smoker  . Smokeless tobacco: Never Used  Substance Use Topics  . Alcohol use: Yes    Alcohol/week: 1.0 standard drinks    Types: 1 Shots of liquor per week    Comment: occasionally  . Drug use: No     Allergies   Patient has no known  allergies.   Review of Systems Review of Systems  All other systems reviewed and are negative.    Physical Exam Updated Vital Signs BP 126/72 (BP Location: Right Arm)   Pulse 90   Temp 99 F (37.2 C) (Oral)   Resp 14   Ht 5\' 5"  (1.651 m)   LMP 10/01/2018   SpO2 100%   BMI 43.27 kg/m   Physical Exam  Constitutional: She appears well-developed and well-nourished.  HENT:  Head: Normocephalic.  Swollen gumline right lower, slight right facial swelling,   Eyes: Pupils are equal, round, and reactive to light.  Neck: Normal range of motion.  Cardiovascular: Normal rate.  Pulmonary/Chest: Effort normal.  Musculoskeletal: Normal range of motion.  Neurological: She is alert.  Skin: Skin is warm.  Psychiatric: She has a normal mood and affect.  Nursing note and vitals reviewed.    ED Treatments / Results  Labs (all labs ordered are listed, but only abnormal results are displayed) Labs Reviewed - No data to display  EKG None  Radiology No results found.  Procedures Procedures (including critical care time)  Medications Ordered in ED Medications  clindamycin (CLEOCIN) capsule 300 mg (has no administration in time range)  HYDROcodone-acetaminophen (NORCO/VICODIN) 5-325 MG per tablet 2 tablet (has no administration in time range)     Initial Impression /  Assessment and Plan / ED Course  I have reviewed the triage vital signs and the nursing notes.  Pertinent labs & imaging results that were available during my care of the patient were reviewed by me and considered in my medical decision making (see chart for details).     MDM  Pt given dental resource information   Final Clinical Impressions(s) / ED Diagnoses   Final diagnoses:  Dental infection    ED Discharge Orders         Ordered    clindamycin (CLEOCIN) 300 MG capsule  4 times daily     10/04/18 2004    HYDROcodone-acetaminophen (NORCO/VICODIN) 5-325 MG tablet  Every 4 hours PRN     10/04/18 2004          An After Visit Summary was printed and given to the patient.    Osie Cheeks 10/04/18 2004    Vanetta Mulders, MD 10/05/18 1530

## 2018-10-04 NOTE — ED Triage Notes (Signed)
Right sided mouth pain and swelling, tooth abscess from September of this year, treated with antibiotics.  No noted distress, patient states drainage from area of abcess from bottom right tooth.

## 2018-10-04 NOTE — Discharge Instructions (Signed)
Return if any problems.

## 2020-01-31 ENCOUNTER — Ambulatory Visit
Admission: EM | Admit: 2020-01-31 | Discharge: 2020-01-31 | Disposition: A | Discharge status: Home / Self Care | Payer: BC Managed Care – PPO | Attending: Emergency Medicine | Admitting: Emergency Medicine

## 2020-01-31 ENCOUNTER — Other Ambulatory Visit: Payer: Self-pay

## 2020-01-31 DIAGNOSIS — Z20822 Contact with and (suspected) exposure to covid-19: Secondary | ICD-10-CM | POA: Diagnosis not present

## 2020-01-31 MED ORDER — FLUTICASONE PROPIONATE 50 MCG/ACT NA SUSP: 1.0000 | Freq: Every day | NASAL | 0 refills | Status: DC

## 2020-01-31 MED ORDER — BENZONATATE 100 MG PO CAPS: 100.0000 mg | ORAL_CAPSULE | Freq: Three times a day (TID) | ORAL | 0 refills | Status: DC

## 2020-01-31 NOTE — ED Provider Notes (Signed)
[facility]    CSN: [id number] Arrival date & time: 01/31/20  1207      History   Chief Complaint Chief Complaint  Patient presents with  . Cough    HPI Madison Wilcox is a 29 y.o. female.   Who presented to the urgent care with a complaint of cough, congestion and positive Covid exposure for the past 3 to 5 days.  Denies sick exposure to  flu or strep.  Denies recent travel.  Denies aggravating or alleviating symptoms.  Denies previous COVID infection.   Denies fever, chills, fatigue,  rhinorrhea, sore throat, SOB, wheezing, chest pain, nausea, vomiting, changes in bowel or bladder habits.       No past medical history on file.  There are no problems to display for this patient.   No past surgical history on file.  OB History   No obstetric history on file.      Home Medications    Prior to Admission medications   Medication Sig Start Date End Date Taking? Authorizing Provider  acetaminophen (TYLENOL) 500 MG tablet Take 1,000 mg by mouth every 6 (six) hours as needed.    [provider]  benzonatate (TESSALON) 100 MG capsule Take 1 capsule (100 mg total) by mouth every 8 (eight) hours. 01/31/20   Sabel Hornbeck, Darrelyn Hillock, FNP  clindamycin (CLEOCIN) 300 MG capsule Take 1 capsule (300 mg total) by mouth 4 (four) times daily. 10/04/18   Fransico Meadow, PA-C  fluticasone (FLONASE) 50 MCG/ACT nasal spray Place 1 spray into both nostrils daily for 14 days. 01/31/20 02/14/20  Mitchell Iwanicki, Darrelyn Hillock, FNP  HYDROcodone-acetaminophen (NORCO/VICODIN) 5-325 MG tablet Take 1 tablet by mouth every 4 (four) hours as needed. 10/04/18   Fransico Meadow, PA-C  ibuprofen (ADVIL,MOTRIN) 600 MG tablet Take 1 tablet (600 mg total) by mouth every 6 (six) hours as needed. 07/27/18   Evalee Jefferson, PA-C  methocarbamol (ROBAXIN) 500 MG tablet Take 2 tablets (1,000 mg total) by mouth every 8 (eight) hours as needed for muscle spasms. 09/17/17   Julianne Rice, MD  ondansetron (ZOFRAN) 4 MG  tablet Take 1 tablet (4 mg total) by mouth every 8 (eight) hours as needed. 07/13/18   Rolland Porter, MD    Family History Family History  Problem Relation Age of Onset  . Diabetes Mother   . Cancer Father     Social History Social History   Tobacco Use  . Smoking status: Never Smoker  . Smokeless tobacco: Never Used  Substance Use Topics  . Alcohol use: Yes    Alcohol/week: 1.0 standard drinks    Types: 1 Shots of liquor per week    Comment: occasionally  . Drug use: No     Allergies   Patient has no known allergies.   Review of Systems Review of Systems  Constitutional: Negative.   HENT: Positive for congestion.   Respiratory: Positive for cough.   Cardiovascular: Negative.   Gastrointestinal: Negative.   Neurological: Negative.   All other systems reviewed and are negative.    Physical Exam Triage Vital Signs ED Triage Vitals  Enc Vitals Group     BP 01/31/20 1232 132/88     Pulse Rate 01/31/20 1232 78     Resp 01/31/20 1232 20     Temp 01/31/20 1232 98.6 F (37 C)     Temp src --      SpO2 01/31/20 1232 95 %     Weight --  Height --      Head Circumference --      Peak Flow --      Pain Score 01/31/20 1230 6     Pain Loc --      Pain Edu? --      Excl. in GC? --    No data found.  Updated Vital Signs BP 132/88   Pulse 78   Temp 98.6 F (37 C)   Resp 20   LMP 01/25/2020 (Approximate)   SpO2 95%   Visual Acuity Right Eye Distance:   Left Eye Distance:   Bilateral Distance:    Right Eye Near:   Left Eye Near:    Bilateral Near:     Physical Exam Vitals and nursing note reviewed.  Constitutional:      General: She is not in acute distress.    Appearance: Normal appearance. She is normal weight. She is not ill-appearing or toxic-appearing.  HENT:     Head: Normocephalic.     Right Ear: Tympanic membrane, ear canal and external ear normal. There is no impacted cerumen.     Left Ear: Tympanic membrane, ear canal and external ear  normal. There is no impacted cerumen.     Nose: Nose normal. No congestion.     Mouth/Throat:     Mouth: Mucous membranes are moist.     Pharynx: Oropharynx is clear. No oropharyngeal exudate or posterior oropharyngeal erythema.  Cardiovascular:     Rate and Rhythm: Normal rate and regular rhythm.     Pulses: Normal pulses.     Heart sounds: Normal heart sounds. No murmur.  Pulmonary:     Effort: Pulmonary effort is normal. No respiratory distress.     Breath sounds: Normal breath sounds. No wheezing or rhonchi.  Chest:     Chest wall: No tenderness.  Neurological:     Mental Status: She is alert and oriented to person, place, and time.      UC Treatments / Results  Labs (all labs ordered are listed, but only abnormal results are displayed) Labs Reviewed  NOVEL CORONAVIRUS, NAA    EKG   Radiology No results found.  Procedures Procedures (including critical care time)  Medications Ordered in UC Medications - No data to display  Initial Impression / Assessment and Plan / UC Course  I have reviewed the triage vital signs and the nursing notes.  Pertinent labs & imaging results that were available during my care of the patient were reviewed by me and considered in my medical decision making (see chart for details).    Patient stable at discharge. COVID-19 test was ordered Flonase and [employer] were prescribed Work note was given Return or go to ED for worsening of symptoms  Final Clinical Impressions(s) / UC Diagnoses   Final diagnoses:  Suspected COVID-19 virus infection     Discharge Instructions     COVID testing ordered.  It will take between 2-7 days for test results.  Someone will contact you regarding abnormal results.    In the meantime: You should remain isolated in your home for 10 days from symptom onset AND greater than 24 hours after symptoms resolution (absence of fever without the use of fever-reducing medication and improvement in  respiratory symptoms), whichever is longer Get plenty of rest and push fluids Tessalon Perles prescribed for cough Flonase prescribed for nasal congestion and runny nose Use medications daily for symptom relief Use OTC medications like ibuprofen or tylenol as needed fever or  pain Call or go to the ED if you have any new or worsening symptoms such as fever, worsening cough, shortness of breath, chest tightness, chest pain, turning blue, changes in mental status, etc...     ED Prescriptions    Medication Sig Dispense Auth. Provider   fluticasone (FLONASE) 50 MCG/ACT nasal spray Place 1 spray into both nostrils daily for 14 days. 16 g Treavon Castilleja S, FNP   benzonatate (TESSALON) 100 MG capsule Take 1 capsule (100 mg total) by mouth every 8 (eight) hours. 30 capsule Steel Kerney, Zachery Dakins, FNP     PDMP not reviewed this encounter.   Durward Parcel, FNP 01/31/20 1250

## 2020-01-31 NOTE — ED Triage Notes (Signed)
Pt has been feeling bad all week and has productive cough , sister also tested positive for covid and has had recent exposure

## 2020-01-31 NOTE — Discharge Instructions (Addendum)
COVID testing ordered.  It will take between 2-7 days for test results.  Someone will contact you regarding abnormal results.    In the meantime: You should remain isolated in your home for 10 days from symptom onset AND greater than 24 hours after symptoms resolution (absence of fever without the use of fever-reducing medication and improvement in respiratory symptoms), whichever is longer Get plenty of rest and push fluids Tessalon Perles prescribed for cough Flonase prescribed for nasal congestion and runny nose Use medications daily for symptom relief Use OTC medications like ibuprofen or tylenol as needed fever or pain Call or go to the ED if you have any new or worsening symptoms such as fever, worsening cough, shortness of breath, chest tightness, chest pain, turning blue, changes in mental status, etc..Marland Kitchen

## 2020-02-01 LAB — SARS-COV-2, NAA 2 DAY TAT

## 2020-02-01 LAB — NOVEL CORONAVIRUS, NAA: SARS-CoV-2, NAA: NOT DETECTED

## 2020-02-03 ENCOUNTER — Other Ambulatory Visit: Payer: Self-pay

## 2020-02-03 ENCOUNTER — Ambulatory Visit: Payer: BC Managed Care – PPO | Attending: Internal Medicine

## 2020-02-03 DIAGNOSIS — Z20822 Contact with and (suspected) exposure to covid-19: Secondary | ICD-10-CM

## 2020-02-04 LAB — NOVEL CORONAVIRUS, NAA: SARS-CoV-2, NAA: NOT DETECTED

## 2020-02-04 LAB — SARS-COV-2, NAA 2 DAY TAT

## 2020-11-06 ENCOUNTER — Encounter: Payer: Self-pay | Admitting: Emergency Medicine

## 2020-11-06 ENCOUNTER — Ambulatory Visit
Admission: EM | Admit: 2020-11-06 | Discharge: 2020-11-06 | Disposition: A | Discharge status: Home / Self Care | Payer: 59 | Attending: Emergency Medicine | Admitting: Emergency Medicine

## 2020-11-06 ENCOUNTER — Other Ambulatory Visit: Payer: Self-pay

## 2020-11-06 DIAGNOSIS — J069 Acute upper respiratory infection, unspecified: Secondary | ICD-10-CM

## 2020-11-06 DIAGNOSIS — Z1152 Encounter for screening for COVID-19: Secondary | ICD-10-CM

## 2020-11-06 MED ORDER — FLUTICASONE PROPIONATE 50 MCG/ACT NA SUSP: 1.0000 | Freq: Every day | NASAL | 0 refills | Status: DC

## 2020-11-06 MED ORDER — CETIRIZINE HCL 10 MG PO TABS: 10.0000 mg | ORAL_TABLET | Freq: Every day | ORAL | 0 refills | Status: DC

## 2020-11-06 MED ORDER — BENZONATATE 100 MG PO CAPS: 100.0000 mg | ORAL_CAPSULE | Freq: Three times a day (TID) | ORAL | 0 refills | Status: AC | PRN

## 2020-11-06 MED ORDER — DEXAMETHASONE 4 MG PO TABS: 4.0000 mg | ORAL_TABLET | Freq: Every day | ORAL | 0 refills | Status: AC

## 2020-11-06 NOTE — ED Provider Notes (Addendum)
[facility]   [id number] 11/06/20 Arrival Time: 1148   CC: COVID symptoms  SUBJECTIVE: History from: patient and family.  Madison Wilcox is a 30 y.o. female who presented to the urgent care for complaint of chills, fever, cough, and nasal congestion for the past 2 to 3 days.  Denies sick exposure to COVID, flu or strep.  Denies recent travel.  Has tried OTC medication without relief.  Denies alleviating or aggravating factors.  Denies previous symptoms in the past.   Denies fever, chills, fatigue, sinus pain, rhinorrhea, sore throat, SOB, wheezing, chest pain, nausea, changes in bowel or bladder habits.     ROS: As per HPI.  All other pertinent ROS negative.     History reviewed. No pertinent past medical history. History reviewed. No pertinent surgical history. No Known Allergies No current facility-administered medications on file prior to encounter.   Current Outpatient Medications on File Prior to Encounter  Medication Sig Dispense Refill  . acetaminophen (TYLENOL) 500 MG tablet Take 1,000 mg by mouth every 6 (six) hours as needed.    . clindamycin (CLEOCIN) 300 MG capsule Take 1 capsule (300 mg total) by mouth 4 (four) times daily. 28 capsule 0  . HYDROcodone-acetaminophen (NORCO/VICODIN) 5-325 MG tablet Take 1 tablet by mouth every 4 (four) hours as needed. 10 tablet 0  . ibuprofen (ADVIL,MOTRIN) 600 MG tablet Take 1 tablet (600 mg total) by mouth every 6 (six) hours as needed. 30 tablet 0  . methocarbamol (ROBAXIN) 500 MG tablet Take 2 tablets (1,000 mg total) by mouth every 8 (eight) hours as needed for muscle spasms. 30 tablet 0  . ondansetron (ZOFRAN) 4 MG tablet Take 1 tablet (4 mg total) by mouth every 8 (eight) hours as needed. 6 tablet 0   Social History   Socioeconomic History  . Marital status: Single    Spouse name: Not on file  . Number of children: Not on file  . Years of education: Not on file  . Highest education level: Not on file  Occupational  History  . Not on file  Tobacco Use  . Smoking status: Never Smoker  . Smokeless tobacco: Never Used  Substance and Sexual Activity  . Alcohol use: Yes    Alcohol/week: 1.0 standard drink    Types: 1 Shots of liquor per week    Comment: occasionally  . Drug use: No  . Sexual activity: Yes    Birth control/protection: Condom  Other Topics Concern  . Not on file  Social History Narrative  . Not on file   Social Determinants of Health   Financial Resource Strain: Not on file  Food Insecurity: Not on file  Transportation Needs: Not on file  Physical Activity: Not on file  Stress: Not on file  Social Connections: Not on file  Intimate Partner Violence: Not on file   Family History  Problem Relation Age of Onset  . Diabetes Mother   . Cancer Father     OBJECTIVE:  Vitals:   11/06/20 1415 11/06/20 1416  BP: 132/85   Pulse: 82   Resp: 18   Temp: 98 F (36.7 C)   TempSrc: Oral   SpO2: 98%   Weight:  280 lb (127 kg)  Height:  5\' 5"  (1.651 m)     General appearance: alert; appears fatigued, but nontoxic; speaking in full sentences and tolerating own secretions HEENT: NCAT; Ears: EACs clear, TMs pearly gray; Eyes: PERRL.  EOM grossly intact. Sinuses: nontender; Nose: nares patent without  rhinorrhea, Throat: oropharynx clear, tonsils non erythematous or enlarged, uvula midline  Neck: supple without LAD Lungs: unlabored respirations, symmetrical air entry; cough: moderate; no respiratory distress; CTAB Heart: regular rate and rhythm.  Radial pulses 2+ symmetrical bilaterally Skin: warm and dry Psychological: alert and cooperative; normal mood and affect  LABS:  No results found for this or any previous visit (from the past 24 hour(s)).   ASSESSMENT & PLAN:  1. Encounter for screening for COVID-19   2. URI with cough and congestion     Meds ordered this encounter  Medications  . fluticasone (FLONASE) 50 MCG/ACT nasal spray    Sig: Place 1 spray into both  nostrils daily for 14 days.    Dispense:  16 g    Refill:  0  . cetirizine (ZYRTEC ALLERGY) 10 MG tablet    Sig: Take 1 tablet (10 mg total) by mouth daily.    Dispense:  30 tablet    Refill:  0  . dexamethasone (DECADRON) 4 MG tablet    Sig: Take 1 tablet (4 mg total) by mouth daily for 7 days.    Dispense:  7 tablet    Refill:  0  . benzonatate (TESSALON) 100 MG capsule    Sig: Take 1 capsule (100 mg total) by mouth 3 (three) times daily as needed for cough.    Dispense:  30 capsule    Refill:  0    Discharge instructions  COVID testing ordered.  It will take between 5-7 days for test results.  Someone will contact you regarding abnormal results.    Get plenty of rest and push fluids Tessalon Perles prescribed for cough Zyrtec for nasal congestion, runny nose, and/or sore throat Flonase for nasal congestion and runny nose Decadron was prescribed Use medications daily for symptom relief Use OTC medications like ibuprofen or tylenol as needed fever or pain Call or go to the ED if you have any new or worsening symptoms such as fever, worsening cough, shortness of breath, chest tightness, chest pain, turning blue, changes in mental status, etc...   Reviewed expectations re: course of current medical issues. Questions answered. Outlined signs and symptoms indicating need for more acute intervention. Patient verbalized understanding. After Visit Summary given.         Durward Parcel, FNP 11/06/20 1501    Durward Parcel, FNP 11/06/20 1506

## 2020-11-06 NOTE — Discharge Instructions (Addendum)
COVID testing ordered.  It will take between 5-7 days for test results.  Someone will contact you regarding abnormal results.    Get plenty of rest and push fluids Tessalon Perles prescribed for cough Zyrtec for nasal congestion, runny nose, and/or sore throat Flonase for nasal congestion and runny nose Decadron was prescribed Use medications daily for symptom relief Use OTC medications like ibuprofen or tylenol as needed fever or pain Call or go to the ED if you have any new or worsening symptoms such as fever, worsening cough, shortness of breath, chest tightness, chest pain, turning blue, changes in mental status, etc..Marland Kitchen

## 2020-11-06 NOTE — ED Triage Notes (Signed)
Stuffy nose on Friday, cough started on Saturday, off and on fever and chills

## 2020-11-08 LAB — COVID-19, FLU A+B NAA
Influenza A, NAA: NOT DETECTED
Influenza B, NAA: NOT DETECTED
SARS-CoV-2, NAA: DETECTED — AB

## 2022-07-04 ENCOUNTER — Ambulatory Visit
Admission: EM | Admit: 2022-07-04 | Discharge: 2022-07-04 | Disposition: A | Discharge status: Home / Self Care | Payer: BC Managed Care – PPO | Attending: Nurse Practitioner | Admitting: Nurse Practitioner

## 2022-07-04 DIAGNOSIS — Z0189 Encounter for other specified special examinations: Secondary | ICD-10-CM | POA: Diagnosis present

## 2022-07-04 DIAGNOSIS — U071 COVID-19: Secondary | ICD-10-CM | POA: Diagnosis not present

## 2022-07-04 DIAGNOSIS — Z1231 Encounter for screening mammogram for malignant neoplasm of breast: Secondary | ICD-10-CM | POA: Diagnosis not present

## 2022-07-04 DIAGNOSIS — J029 Acute pharyngitis, unspecified: Secondary | ICD-10-CM | POA: Diagnosis not present

## 2022-07-04 DIAGNOSIS — J069 Acute upper respiratory infection, unspecified: Secondary | ICD-10-CM

## 2022-07-04 LAB — RESP PANEL BY RT-PCR (FLU A&B, COVID) ARPGX2
Influenza A by PCR: NEGATIVE
Influenza B by PCR: NEGATIVE
SARS Coronavirus 2 by RT PCR: POSITIVE — AB

## 2022-07-04 LAB — POCT RAPID STREP A (OFFICE): Rapid Strep A Screen: NEGATIVE

## 2022-07-04 MED ORDER — FLUTICASONE PROPIONATE 50 MCG/ACT NA SUSP: 2.0000 | Freq: Every day | NASAL | 0 refills | Status: AC

## 2022-07-04 MED ORDER — CETIRIZINE HCL 10 MG PO TABS: 10.0000 mg | ORAL_TABLET | Freq: Every day | ORAL | 0 refills | Status: AC

## 2022-07-04 MED ORDER — PSEUDOEPH-BROMPHEN-DM 30-2-10 MG/5ML PO SYRP: 5.0000 mL | ORAL_SOLUTION | Freq: Four times a day (QID) | ORAL | 0 refills | Status: AC | PRN

## 2022-07-04 NOTE — ED Triage Notes (Signed)
Pt reports headache, cough, sore throat, body aches and  chills sine this morning.  Pt requested Strep and COVID test.

## 2022-07-04 NOTE — Discharge Instructions (Signed)
The rapid strep test was negative.  A throat culture and COVID test are pending at this time.  You will be contacted if either of the results are positive to discuss treatment.  As discussed, you will be a candidate to receive molnupiravir for an antiviral if your COVID test is positive. Take medication as prescribed. Increase fluids and allow for plenty of rest. Recommend Tylenol or ibuprofen as needed for pain, fever, or general discomfort. Warm salt water gargles 3-4 times daily to help with throat pain or discomfort. Recommend using a humidifier at bedtime during sleep to help with cough and nasal congestion. Sleep elevated on 2 pillows cough symptoms persist. Follow-up in this clinic or with your primary care if symptoms do not improve within the next 7 to 10 days or sooner if they worsen.

## 2022-07-04 NOTE — ED Provider Notes (Signed)
[facility]    CSN: [id number] Arrival date & time: 07/04/22  1028      History   Chief Complaint Chief Complaint  Patient presents with   Sore Throat   Headache   Generalized Body Aches        Cough    HPI Madison Wilcox is a 31 y.o. female.   The history is provided by the patient.   Patient presents for complaints of upper respiratory symptoms that started over the past 24 hours.  Patient complains of feeling hot and cold, sore throat, headache, nasal congestion, and generalized body aches.  She denies known fever, wheezing, shortness of breath, difficulty breathing, or GI symptoms.  She took a home COVID test that was negative.  She states that she has been around her family who has recently tested positive for COVID.  She has been taking over-the-counter daytime/nighttime cough and cold medicine for her symptoms. History reviewed. No pertinent past medical history.  There are no problems to display for this patient.   History reviewed. No pertinent surgical history.  OB History   No obstetric history on file.      Home Medications    Prior to Admission medications   Medication Sig Start Date End Date Taking? Authorizing Provider  brompheniramine-pseudoephedrine-DM 30-2-10 MG/5ML syrup Take 5 mLs by mouth 4 (four) times daily as needed. 07/04/22  Yes Schyler Butikofer-Warren, Sadie Haber, NP  cetirizine (ZYRTEC) 10 MG tablet Take 1 tablet (10 mg total) by mouth daily. 07/04/22  Yes Nael Petrosyan-Warren, Sadie Haber, NP  fluticasone (FLONASE) 50 MCG/ACT nasal spray Place 2 sprays into both nostrils daily. 07/04/22  Yes Perry Brucato-Warren, Sadie Haber, NP  acetaminophen (TYLENOL) 500 MG tablet Take 1,000 mg by mouth every 6 (six) hours as needed.    [provider]  benzonatate (TESSALON) 100 MG capsule Take 1 capsule (100 mg total) by mouth 3 (three) times daily as needed for cough. 11/06/20   Avegno, Zachery Dakins, FNP  clindamycin (CLEOCIN) 300 MG capsule Take 1 capsule (300 mg  total) by mouth 4 (four) times daily. 10/04/18   Elson Areas, PA-C  HYDROcodone-acetaminophen (NORCO/VICODIN) 5-325 MG tablet Take 1 tablet by mouth every 4 (four) hours as needed. 10/04/18   Elson Areas, PA-C  ibuprofen (ADVIL,MOTRIN) 600 MG tablet Take 1 tablet (600 mg total) by mouth every 6 (six) hours as needed. 07/27/18   Burgess Amor, PA-C  methocarbamol (ROBAXIN) 500 MG tablet Take 2 tablets (1,000 mg total) by mouth every 8 (eight) hours as needed for muscle spasms. 09/17/17   Loren Racer, MD  ondansetron (ZOFRAN) 4 MG tablet Take 1 tablet (4 mg total) by mouth every 8 (eight) hours as needed. 07/13/18   Devoria Albe, MD    Family History Family History  Problem Relation Age of Onset   Diabetes Mother    Cancer Father     Social History Social History   Tobacco Use   Smoking status: Never   Smokeless tobacco: Never  Substance Use Topics   Alcohol use: Yes    Alcohol/week: 1.0 standard drink of alcohol    Types: 1 Shots of liquor per week    Comment: occasionally   Drug use: No     Allergies   Patient has no known allergies.   Review of Systems Review of Systems Per HPI  Physical Exam Triage Vital Signs ED Triage Vitals  Enc Vitals Group     BP 07/04/22 1058 (!) 146/76     Pulse  Rate 07/04/22 1058 92     Resp 07/04/22 1058 18     Temp 07/04/22 1058 98.8 F (37.1 C)     Temp Source 07/04/22 1058 Oral     SpO2 07/04/22 1058 98 %     Weight --      Height --      Head Circumference --      Peak Flow --      Pain Score 07/04/22 1057 8     Pain Loc --      Pain Edu? --      Excl. in GC? --    No data found.  Updated Vital Signs BP (!) 146/76 (BP Location: Right Arm)   Pulse 92   Temp 98.8 F (37.1 C) (Oral)   Resp 18   LMP  (Within Days)   SpO2 98%   Visual Acuity Right Eye Distance:   Left Eye Distance:   Bilateral Distance:    Right Eye Near:   Left Eye Near:    Bilateral Near:     Physical Exam Vitals and nursing note reviewed.   Constitutional:      General: She is not in acute distress.    Appearance: She is well-developed.  HENT:     Head: Normocephalic.     Right Ear: Tympanic membrane and ear canal normal.     Left Ear: Tympanic membrane and ear canal normal.     Nose: Congestion present. No rhinorrhea.     Mouth/Throat:     Mouth: Mucous membranes are moist.     Pharynx: Pharyngeal swelling and posterior oropharyngeal erythema present. No uvula swelling.     Tonsils: No tonsillar exudate. 1+ on the right. 1+ on the left.  Eyes:     Conjunctiva/sclera: Conjunctivae normal.     Pupils: Pupils are equal, round, and reactive to light.  Cardiovascular:     Rate and Rhythm: Regular rhythm.     Heart sounds: Normal heart sounds.  Pulmonary:     Effort: Pulmonary effort is normal. No respiratory distress.     Breath sounds: Normal breath sounds. No stridor. No wheezing, rhonchi or rales.  Abdominal:     General: Bowel sounds are normal.     Palpations: Abdomen is soft.     Tenderness: There is no abdominal tenderness.  Musculoskeletal:     Cervical back: Normal range of motion.  Lymphadenopathy:     Cervical: No cervical adenopathy.  Skin:    General: Skin is warm and dry.  Neurological:     General: No focal deficit present.     Mental Status: She is alert and oriented to person, place, and time.  Psychiatric:        Mood and Affect: Mood normal.        Behavior: Behavior normal.      UC Treatments / Results  Labs (all labs ordered are listed, but only abnormal results are displayed) Labs Reviewed  COVID-19, FLU A+B AND RSV  RESP PANEL BY RT-PCR (FLU A&B, COVID) ARPGX2  POCT RAPID STREP A (OFFICE)    EKG   Radiology No results found.  Procedures Procedures (including critical care time)  Medications Ordered in UC Medications - No data to display  Initial Impression / Assessment and Plan / UC Course  I have reviewed the triage vital signs and the nursing notes.  Pertinent labs  & imaging results that were available during my care of the patient were reviewed by me and considered  in my medical decision making (see chart for details).  Patient presents with a 1 day history of upper respiratory symptoms.  On exam, her vitals are stable, she is in no acute distress.  Rapid strep test is negative.  Symptoms appear to be consistent with a viral upper respiratory infection at this time.  This is pending the results of the throat culture and COVID test.  Supportive care recommendations were provided to the patient.  Patient was provided symptomatic treatment with Bromfed, fluticasone, and cetirizine in the meantime.  Patient advised she will be contacted if her pending test are positive.  Discussed strict return precautions with the patient of when to follow-up in this clinic.  Patient verbalizes understanding.  All questions were answered. Final Clinical Impressions(s) / UC Diagnoses   Final diagnoses:  Patient requested diagnostic testing  Viral upper respiratory illness     Discharge Instructions      The rapid strep test was negative.  A throat culture and COVID test are pending at this time.  You will be contacted if either of the results are positive to discuss treatment.  As discussed, you will be a candidate to receive molnupiravir for an antiviral if your COVID test is positive. Take medication as prescribed. Increase fluids and allow for plenty of rest. Recommend Tylenol or ibuprofen as needed for pain, fever, or general discomfort. Warm salt water gargles 3-4 times daily to help with throat pain or discomfort. Recommend using a humidifier at bedtime during sleep to help with cough and nasal congestion. Sleep elevated on 2 pillows cough symptoms persist. Follow-up in this clinic or with your primary care if symptoms do not improve within the next 7 to 10 days or sooner if they worsen.     ED Prescriptions     Medication Sig Dispense Auth. Provider    brompheniramine-pseudoephedrine-DM 30-2-10 MG/5ML syrup Take 5 mLs by mouth 4 (four) times daily as needed. 140 mL Elex Mainwaring-Warren, Sadie Haber, NP   fluticasone (FLONASE) 50 MCG/ACT nasal spray Place 2 sprays into both nostrils daily. 16 g Benigno Check-Warren, Sadie Haber, NP   cetirizine (ZYRTEC) 10 MG tablet Take 1 tablet (10 mg total) by mouth daily. 30 tablet Jaide Hillenburg-Warren, Sadie Haber, NP      PDMP not reviewed this encounter.   Abran Cantor, NP 07/04/22 1141

## 2022-07-05 ENCOUNTER — Telehealth (HOSPITAL_COMMUNITY): Payer: Self-pay | Admitting: Emergency Medicine

## 2022-07-05 MED ORDER — MOLNUPIRAVIR EUA 200MG CAPSULE: 4.0000 | ORAL_CAPSULE | Freq: Two times a day (BID) | ORAL | 0 refills | Status: AC

## 2022-07-06 LAB — CULTURE, GROUP A STREP (THRC)

## 2024-05-27 ENCOUNTER — Encounter: Admitting: Obstetrics and Gynecology

## 2024-11-24 ENCOUNTER — Other Ambulatory Visit: Payer: Self-pay

## 2024-11-24 ENCOUNTER — Ambulatory Visit
Admission: EM | Admit: 2024-11-24 | Discharge: 2024-11-24 | Disposition: A | Discharge status: Home / Self Care | Attending: Family Medicine | Admitting: Family Medicine

## 2024-11-24 DIAGNOSIS — J111 Influenza due to unidentified influenza virus with other respiratory manifestations: Secondary | ICD-10-CM

## 2024-11-24 DIAGNOSIS — J069 Acute upper respiratory infection, unspecified: Secondary | ICD-10-CM | POA: Diagnosis not present

## 2024-11-24 DIAGNOSIS — R509 Fever, unspecified: Secondary | ICD-10-CM

## 2024-11-24 LAB — POC COVID19/FLU A&B COMBO
Covid Antigen, POC: NEGATIVE
Influenza A Antigen, POC: NEGATIVE
Influenza B Antigen, POC: NEGATIVE

## 2024-11-24 MED ORDER — BENZONATATE 200 MG PO CAPS: 200.0000 mg | ORAL_CAPSULE | Freq: Three times a day (TID) | ORAL | 0 refills | Status: AC | PRN

## 2024-11-24 MED ORDER — OSELTAMIVIR PHOSPHATE 75 MG PO CAPS: 75.0000 mg | ORAL_CAPSULE | Freq: Two times a day (BID) | ORAL | 0 refills | Status: AC

## 2024-11-24 MED ORDER — AZELASTINE HCL 0.1 % NA SOLN: 1.0000 | Freq: Two times a day (BID) | NASAL | 0 refills | Status: AC

## 2024-11-24 NOTE — Discharge Instructions (Signed)
 Your rapid flu and COVID test was negative, however your symptoms are suspicious for influenza so we will treat you with Tamiflu  in addition to symptomatic medications such as Tessalon  Perles and nasal spray.  You may use over-the-counter cold and congestion medications, pain and fever reducers as needed.  Return for worsening or unresolving symptoms

## 2024-11-24 NOTE — ED Triage Notes (Signed)
 Pt being seen in UC for generalized body aches, fever, chills, cough, and nasal congestion since this morning. Pt reports taking motrin  at home. Pt reports having recent sick contacts.

## 2024-11-24 NOTE — ED Provider Notes (Addendum)
 " [facility]    CSN: [id number] Arrival date & time: 11/24/24  1158      History   Chief Complaint Chief Complaint  Patient presents with   Generalized Body Aches   Cough   Nasal Congestion    HPI Madison Wilcox is a 34 y.o. female.   Patient presenting today with new onset body aches, fever, chills, cough, congestion that started upon waking up this morning.  Denies chest pain, shortness of breath, vomiting, diarrhea, rashes.  So far trying Motrin  with minimal relief.  Multiple sick contacts recently.    History reviewed. No pertinent past medical history.  There are no active problems to display for this patient.   History reviewed. No pertinent surgical history.  OB History   No obstetric history on file.      Home Medications    Prior to Admission medications  Medication Sig Start Date End Date Taking? Authorizing Provider  azelastine  (ASTELIN ) 0.1 % nasal spray Place 1 spray into both nostrils 2 (two) times daily. Use in each nostril as directed 11/24/24  Yes Stuart Vernell Norris, PA-C  benzonatate  (TESSALON ) 200 MG capsule Take 1 capsule (200 mg total) by mouth 3 (three) times daily as needed for cough. 11/24/24  Yes Stuart Vernell Norris, PA-C  oseltamivir  (TAMIFLU ) 75 MG capsule Take 1 capsule (75 mg total) by mouth every 12 (twelve) hours. 11/24/24  Yes Stuart Vernell Norris, PA-C  acetaminophen  (TYLENOL ) 500 MG tablet Take 1,000 mg by mouth every 6 (six) hours as needed.    [provider]  benzonatate  (TESSALON ) 100 MG capsule Take 1 capsule (100 mg total) by mouth 3 (three) times daily as needed for cough. Patient not taking: Reported on 11/24/2024 11/06/20   Avegno, Komlanvi S, FNP  brompheniramine-pseudoephedrine -DM 30-2-10 MG/5ML syrup Take 5 mLs by mouth 4 (four) times daily as needed. Patient not taking: Reported on 11/24/2024 07/04/22   Leath-Warren, Etta PARAS, NP  cetirizine  (ZYRTEC ) 10 MG tablet Take 1 tablet (10 mg total) by mouth  daily. Patient not taking: Reported on 11/24/2024 07/04/22   Leath-Warren, Etta PARAS, NP  clindamycin  (CLEOCIN ) 300 MG capsule Take 1 capsule (300 mg total) by mouth 4 (four) times daily. Patient not taking: Reported on 11/24/2024 10/04/18   Sofia, Leslie K, PA-C  fluticasone  (FLONASE ) 50 MCG/ACT nasal spray Place 2 sprays into both nostrils daily. Patient not taking: Reported on 11/24/2024 07/04/22   Leath-Warren, Etta PARAS, NP  HYDROcodone -acetaminophen  (NORCO/VICODIN) 5-325 MG tablet Take 1 tablet by mouth every 4 (four) hours as needed. Patient not taking: Reported on 11/24/2024 10/04/18   Sofia, Leslie K, PA-C  ibuprofen  (ADVIL ,MOTRIN ) 600 MG tablet Take 1 tablet (600 mg total) by mouth every 6 (six) hours as needed. 07/27/18   Idol, Julie, PA-C  methocarbamol  (ROBAXIN ) 500 MG tablet Take 2 tablets (1,000 mg total) by mouth every 8 (eight) hours as needed for muscle spasms. Patient not taking: Reported on 11/24/2024 09/17/17   Carlyle Lenis, MD  ondansetron  (ZOFRAN ) 4 MG tablet Take 1 tablet (4 mg total) by mouth every 8 (eight) hours as needed. Patient not taking: Reported on 11/24/2024 07/13/18   Knapp, Iva, MD    Family History Family History  Problem Relation Age of Onset   Diabetes Mother    Cancer Father     Social History Social History[1]   Allergies   Patient has no known allergies.   Review of Systems Review of Systems PER HPI  Physical Exam Triage Vital Signs ED  Triage Vitals [11/24/24 1210]  Encounter Vitals Group     BP 138/83     Girls Systolic BP Percentile      Girls Diastolic BP Percentile      Boys Systolic BP Percentile      Boys Diastolic BP Percentile      Pulse Rate (!) 107     Resp 20     Temp (!) 100.7 F (38.2 C)     Temp Source Oral     SpO2 97 %     Weight      Height      Head Circumference      Peak Flow      Pain Score 8     Pain Loc      Pain Education      Exclude from Growth Chart    No data found.  Updated Vital Signs BP  138/83 (BP Location: Right Arm)   Pulse (!) 107   Temp (!) 100.7 F (38.2 C) (Oral)   Resp 20   LMP 10/25/2024 (Approximate)   SpO2 97%   Visual Acuity Right Eye Distance:   Left Eye Distance:   Bilateral Distance:    Right Eye Near:   Left Eye Near:    Bilateral Near:     Physical Exam Vitals and nursing note reviewed.  Constitutional:      Appearance: Normal appearance.  HENT:     Head: Atraumatic.     Right Ear: Tympanic membrane and external ear normal.     Left Ear: Tympanic membrane and external ear normal.     Nose: Rhinorrhea present.     Mouth/Throat:     Mouth: Mucous membranes are moist.     Pharynx: Posterior oropharyngeal erythema present.  Eyes:     Extraocular Movements: Extraocular movements intact.     Conjunctiva/sclera: Conjunctivae normal.  Cardiovascular:     Rate and Rhythm: Normal rate and regular rhythm.     Heart sounds: Normal heart sounds.  Pulmonary:     Effort: Pulmonary effort is normal.     Breath sounds: Normal breath sounds. No wheezing or rales.  Musculoskeletal:        General: Normal range of motion.     Cervical back: Normal range of motion and neck supple.  Skin:    General: Skin is warm and dry.  Neurological:     Mental Status: She is alert and oriented to person, place, and time.  Psychiatric:        Mood and Affect: Mood normal.        Thought Content: Thought content normal.      UC Treatments / Results  Labs (all labs ordered are listed, but only abnormal results are displayed) Labs Reviewed  POC COVID19/FLU A&B COMBO    EKG   Radiology No results found.  Procedures Procedures (including critical care time)  Medications Ordered in UC Medications - No data to display  Initial Impression / Assessment and Plan / UC Course  I have reviewed the triage vital signs and the nursing notes.  Pertinent labs & imaging results that were available during my care of the patient were reviewed by me and considered in  my medical decision making (see chart for details).     Febrile and tachycardic in triage, otherwise vital signs reassuring.  She is well-appearing and in no acute distress.  Rapid flu and COVID-negative but do suspect influenza given symptoms.  Treat with Tamiflu , Tessalon , Astelin , supportive over-the-counter  medications and home care.  Return for worsening or unresolving symptoms.  Final Clinical Impressions(s) / UC Diagnoses   Final diagnoses:  Viral URI  Influenza-like illness  Fever, unspecified     Discharge Instructions      Your rapid flu and COVID test was negative, however your symptoms are suspicious for influenza so we will treat you with Tamiflu  in addition to symptomatic medications such as Tessalon  Perles and nasal spray.  You may use over-the-counter cold and congestion medications, pain and fever reducers as needed.  Return for worsening or unresolving symptoms    ED Prescriptions     Medication Sig Dispense Auth. Provider   oseltamivir  (TAMIFLU ) 75 MG capsule Take 1 capsule (75 mg total) by mouth every 12 (twelve) hours. 10 capsule Stuart Vernell Norris, PA-C   benzonatate  (TESSALON ) 200 MG capsule Take 1 capsule (200 mg total) by mouth 3 (three) times daily as needed for cough. 20 capsule Stuart Vernell Norris, PA-C   azelastine  (ASTELIN ) 0.1 % nasal spray Place 1 spray into both nostrils 2 (two) times daily. Use in each nostril as directed 30 mL Stuart Vernell Norris, PA-C      PDMP not reviewed this encounter.    Stuart Vernell Norris, [place] 11/24/24 1304     [1]  Social History Tobacco Use   Smoking status: Never   Smokeless tobacco: Never  Vaping Use   Vaping status: Never Used  Substance Use Topics   Alcohol use: Yes    Alcohol/week: 1.0 standard drink of alcohol    Types: 1 Shots of liquor per week    Comment: occasionally   Drug use: No     Stuart Vernell Norris, PA-C 11/24/24 1304  "
